# Patient Record
Sex: Male | Born: 1935 | Race: White | Hispanic: No | Marital: Married | Smoking: Former smoker
Health system: Southern US, Community
[De-identification: ages and names within clinical notes are randomized; demographics above are authoritative.]

---

## 2000-01-01 ENCOUNTER — Other Ambulatory Visit: Admission: RE | Admit: 2000-01-01 | Discharge: 2000-01-01 | Payer: Self-pay | Admitting: Urology

## 2006-03-16 ENCOUNTER — Ambulatory Visit (HOSPITAL_COMMUNITY): Admission: RE | Admit: 2006-03-16 | Discharge: 2006-03-16 | Payer: Self-pay | Admitting: *Deleted

## 2011-05-04 DIAGNOSIS — H524 Presbyopia: Secondary | ICD-10-CM | POA: Diagnosis not present

## 2011-05-04 DIAGNOSIS — Z961 Presence of intraocular lens: Secondary | ICD-10-CM | POA: Diagnosis not present

## 2011-05-04 DIAGNOSIS — H35039 Hypertensive retinopathy, unspecified eye: Secondary | ICD-10-CM | POA: Diagnosis not present

## 2011-05-04 DIAGNOSIS — H35379 Puckering of macula, unspecified eye: Secondary | ICD-10-CM | POA: Diagnosis not present

## 2011-05-12 DIAGNOSIS — E789 Disorder of lipoprotein metabolism, unspecified: Secondary | ICD-10-CM | POA: Diagnosis not present

## 2011-05-19 DIAGNOSIS — N400 Enlarged prostate without lower urinary tract symptoms: Secondary | ICD-10-CM | POA: Diagnosis not present

## 2011-05-19 DIAGNOSIS — M79609 Pain in unspecified limb: Secondary | ICD-10-CM | POA: Diagnosis not present

## 2011-05-19 DIAGNOSIS — E789 Disorder of lipoprotein metabolism, unspecified: Secondary | ICD-10-CM | POA: Diagnosis not present

## 2011-06-24 DIAGNOSIS — M25559 Pain in unspecified hip: Secondary | ICD-10-CM | POA: Diagnosis not present

## 2011-06-24 DIAGNOSIS — M76899 Other specified enthesopathies of unspecified lower limb, excluding foot: Secondary | ICD-10-CM | POA: Diagnosis not present

## 2011-06-24 DIAGNOSIS — M6740 Ganglion, unspecified site: Secondary | ICD-10-CM | POA: Diagnosis not present

## 2011-09-11 DIAGNOSIS — E789 Disorder of lipoprotein metabolism, unspecified: Secondary | ICD-10-CM | POA: Diagnosis not present

## 2011-09-25 DIAGNOSIS — M79609 Pain in unspecified limb: Secondary | ICD-10-CM | POA: Diagnosis not present

## 2011-09-25 DIAGNOSIS — E789 Disorder of lipoprotein metabolism, unspecified: Secondary | ICD-10-CM | POA: Diagnosis not present

## 2011-09-25 DIAGNOSIS — N400 Enlarged prostate without lower urinary tract symptoms: Secondary | ICD-10-CM | POA: Diagnosis not present

## 2011-11-09 DIAGNOSIS — H1130 Conjunctival hemorrhage, unspecified eye: Secondary | ICD-10-CM | POA: Diagnosis not present

## 2011-12-22 DIAGNOSIS — Z23 Encounter for immunization: Secondary | ICD-10-CM | POA: Diagnosis not present

## 2012-01-18 DIAGNOSIS — E789 Disorder of lipoprotein metabolism, unspecified: Secondary | ICD-10-CM | POA: Diagnosis not present

## 2012-01-18 DIAGNOSIS — Z125 Encounter for screening for malignant neoplasm of prostate: Secondary | ICD-10-CM | POA: Diagnosis not present

## 2012-01-18 DIAGNOSIS — Z0000 Encounter for general adult medical examination without abnormal findings: Secondary | ICD-10-CM | POA: Diagnosis not present

## 2012-01-18 DIAGNOSIS — Z79899 Other long term (current) drug therapy: Secondary | ICD-10-CM | POA: Diagnosis not present

## 2012-01-25 DIAGNOSIS — E789 Disorder of lipoprotein metabolism, unspecified: Secondary | ICD-10-CM | POA: Diagnosis not present

## 2012-01-25 DIAGNOSIS — G252 Other specified forms of tremor: Secondary | ICD-10-CM | POA: Diagnosis not present

## 2012-01-25 DIAGNOSIS — N400 Enlarged prostate without lower urinary tract symptoms: Secondary | ICD-10-CM | POA: Diagnosis not present

## 2012-02-05 DIAGNOSIS — G250 Essential tremor: Secondary | ICD-10-CM | POA: Diagnosis not present

## 2012-02-05 DIAGNOSIS — G252 Other specified forms of tremor: Secondary | ICD-10-CM | POA: Diagnosis not present

## 2012-05-02 DIAGNOSIS — G250 Essential tremor: Secondary | ICD-10-CM | POA: Diagnosis not present

## 2012-05-02 DIAGNOSIS — G252 Other specified forms of tremor: Secondary | ICD-10-CM | POA: Diagnosis not present

## 2012-05-02 DIAGNOSIS — N400 Enlarged prostate without lower urinary tract symptoms: Secondary | ICD-10-CM | POA: Diagnosis not present

## 2012-05-02 DIAGNOSIS — E789 Disorder of lipoprotein metabolism, unspecified: Secondary | ICD-10-CM | POA: Diagnosis not present

## 2012-05-05 DIAGNOSIS — H35039 Hypertensive retinopathy, unspecified eye: Secondary | ICD-10-CM | POA: Diagnosis not present

## 2012-05-05 DIAGNOSIS — Z961 Presence of intraocular lens: Secondary | ICD-10-CM | POA: Diagnosis not present

## 2012-06-03 DIAGNOSIS — N400 Enlarged prostate without lower urinary tract symptoms: Secondary | ICD-10-CM | POA: Diagnosis not present

## 2012-06-03 DIAGNOSIS — R5381 Other malaise: Secondary | ICD-10-CM | POA: Diagnosis not present

## 2012-06-03 DIAGNOSIS — E789 Disorder of lipoprotein metabolism, unspecified: Secondary | ICD-10-CM | POA: Diagnosis not present

## 2012-06-03 DIAGNOSIS — R5383 Other fatigue: Secondary | ICD-10-CM | POA: Diagnosis not present

## 2012-11-24 DIAGNOSIS — Z23 Encounter for immunization: Secondary | ICD-10-CM | POA: Diagnosis not present

## 2013-01-18 DIAGNOSIS — E789 Disorder of lipoprotein metabolism, unspecified: Secondary | ICD-10-CM | POA: Diagnosis not present

## 2013-01-18 DIAGNOSIS — Z79899 Other long term (current) drug therapy: Secondary | ICD-10-CM | POA: Diagnosis not present

## 2013-01-18 DIAGNOSIS — Z125 Encounter for screening for malignant neoplasm of prostate: Secondary | ICD-10-CM | POA: Diagnosis not present

## 2013-01-25 DIAGNOSIS — I10 Essential (primary) hypertension: Secondary | ICD-10-CM | POA: Diagnosis not present

## 2013-01-25 DIAGNOSIS — N400 Enlarged prostate without lower urinary tract symptoms: Secondary | ICD-10-CM | POA: Diagnosis not present

## 2013-01-25 DIAGNOSIS — E789 Disorder of lipoprotein metabolism, unspecified: Secondary | ICD-10-CM | POA: Diagnosis not present

## 2013-01-25 DIAGNOSIS — Z23 Encounter for immunization: Secondary | ICD-10-CM | POA: Diagnosis not present

## 2013-02-01 DIAGNOSIS — H6120 Impacted cerumen, unspecified ear: Secondary | ICD-10-CM | POA: Diagnosis not present

## 2013-02-03 DIAGNOSIS — N401 Enlarged prostate with lower urinary tract symptoms: Secondary | ICD-10-CM | POA: Diagnosis not present

## 2013-02-03 DIAGNOSIS — N139 Obstructive and reflux uropathy, unspecified: Secondary | ICD-10-CM | POA: Diagnosis not present

## 2013-07-26 DIAGNOSIS — I10 Essential (primary) hypertension: Secondary | ICD-10-CM | POA: Diagnosis not present

## 2013-07-26 DIAGNOSIS — N400 Enlarged prostate without lower urinary tract symptoms: Secondary | ICD-10-CM | POA: Diagnosis not present

## 2013-07-26 DIAGNOSIS — E789 Disorder of lipoprotein metabolism, unspecified: Secondary | ICD-10-CM | POA: Diagnosis not present

## 2013-08-02 DIAGNOSIS — E789 Disorder of lipoprotein metabolism, unspecified: Secondary | ICD-10-CM | POA: Diagnosis not present

## 2013-08-23 DIAGNOSIS — H04129 Dry eye syndrome of unspecified lacrimal gland: Secondary | ICD-10-CM | POA: Diagnosis not present

## 2013-08-23 DIAGNOSIS — H524 Presbyopia: Secondary | ICD-10-CM | POA: Diagnosis not present

## 2013-08-23 DIAGNOSIS — H26499 Other secondary cataract, unspecified eye: Secondary | ICD-10-CM | POA: Diagnosis not present

## 2013-08-23 DIAGNOSIS — H35379 Puckering of macula, unspecified eye: Secondary | ICD-10-CM | POA: Diagnosis not present

## 2013-11-09 DIAGNOSIS — H5789 Other specified disorders of eye and adnexa: Secondary | ICD-10-CM | POA: Diagnosis not present

## 2013-11-27 DIAGNOSIS — Z23 Encounter for immunization: Secondary | ICD-10-CM | POA: Diagnosis not present

## 2014-01-22 DIAGNOSIS — Z125 Encounter for screening for malignant neoplasm of prostate: Secondary | ICD-10-CM | POA: Diagnosis not present

## 2014-01-22 DIAGNOSIS — Z79899 Other long term (current) drug therapy: Secondary | ICD-10-CM | POA: Diagnosis not present

## 2014-01-22 DIAGNOSIS — I10 Essential (primary) hypertension: Secondary | ICD-10-CM | POA: Diagnosis not present

## 2014-01-22 DIAGNOSIS — E789 Disorder of lipoprotein metabolism, unspecified: Secondary | ICD-10-CM | POA: Diagnosis not present

## 2014-01-29 DIAGNOSIS — N400 Enlarged prostate without lower urinary tract symptoms: Secondary | ICD-10-CM | POA: Diagnosis not present

## 2014-01-29 DIAGNOSIS — E032 Hypothyroidism due to medicaments and other exogenous substances: Secondary | ICD-10-CM | POA: Diagnosis not present

## 2014-01-29 DIAGNOSIS — I10 Essential (primary) hypertension: Secondary | ICD-10-CM | POA: Diagnosis not present

## 2014-01-29 DIAGNOSIS — G250 Essential tremor: Secondary | ICD-10-CM | POA: Diagnosis not present

## 2014-07-24 DIAGNOSIS — E789 Disorder of lipoprotein metabolism, unspecified: Secondary | ICD-10-CM | POA: Diagnosis not present

## 2014-07-31 DIAGNOSIS — Z23 Encounter for immunization: Secondary | ICD-10-CM | POA: Diagnosis not present

## 2014-07-31 DIAGNOSIS — N400 Enlarged prostate without lower urinary tract symptoms: Secondary | ICD-10-CM | POA: Diagnosis not present

## 2014-07-31 DIAGNOSIS — I10 Essential (primary) hypertension: Secondary | ICD-10-CM | POA: Diagnosis not present

## 2014-07-31 DIAGNOSIS — E789 Disorder of lipoprotein metabolism, unspecified: Secondary | ICD-10-CM | POA: Diagnosis not present

## 2014-11-29 DIAGNOSIS — Z23 Encounter for immunization: Secondary | ICD-10-CM | POA: Diagnosis not present

## 2015-01-24 DIAGNOSIS — E789 Disorder of lipoprotein metabolism, unspecified: Secondary | ICD-10-CM | POA: Diagnosis not present

## 2015-01-24 DIAGNOSIS — Z0000 Encounter for general adult medical examination without abnormal findings: Secondary | ICD-10-CM | POA: Diagnosis not present

## 2015-01-24 DIAGNOSIS — I10 Essential (primary) hypertension: Secondary | ICD-10-CM | POA: Diagnosis not present

## 2015-01-24 DIAGNOSIS — Z125 Encounter for screening for malignant neoplasm of prostate: Secondary | ICD-10-CM | POA: Diagnosis not present

## 2015-01-31 DIAGNOSIS — E789 Disorder of lipoprotein metabolism, unspecified: Secondary | ICD-10-CM | POA: Diagnosis not present

## 2015-01-31 DIAGNOSIS — N390 Urinary tract infection, site not specified: Secondary | ICD-10-CM | POA: Diagnosis not present

## 2015-01-31 DIAGNOSIS — N400 Enlarged prostate without lower urinary tract symptoms: Secondary | ICD-10-CM | POA: Diagnosis not present

## 2015-01-31 DIAGNOSIS — F439 Reaction to severe stress, unspecified: Secondary | ICD-10-CM | POA: Diagnosis not present

## 2015-01-31 DIAGNOSIS — R011 Cardiac murmur, unspecified: Secondary | ICD-10-CM | POA: Diagnosis not present

## 2015-02-21 DIAGNOSIS — R351 Nocturia: Secondary | ICD-10-CM | POA: Diagnosis not present

## 2015-02-21 DIAGNOSIS — R972 Elevated prostate specific antigen [PSA]: Secondary | ICD-10-CM | POA: Diagnosis not present

## 2015-02-21 DIAGNOSIS — N401 Enlarged prostate with lower urinary tract symptoms: Secondary | ICD-10-CM | POA: Diagnosis not present

## 2015-07-30 DIAGNOSIS — E789 Disorder of lipoprotein metabolism, unspecified: Secondary | ICD-10-CM | POA: Diagnosis not present

## 2015-07-30 DIAGNOSIS — N400 Enlarged prostate without lower urinary tract symptoms: Secondary | ICD-10-CM | POA: Diagnosis not present

## 2015-08-01 DIAGNOSIS — E789 Disorder of lipoprotein metabolism, unspecified: Secondary | ICD-10-CM | POA: Diagnosis not present

## 2015-08-01 DIAGNOSIS — I10 Essential (primary) hypertension: Secondary | ICD-10-CM | POA: Diagnosis not present

## 2015-11-26 DIAGNOSIS — Z23 Encounter for immunization: Secondary | ICD-10-CM | POA: Diagnosis not present

## 2016-01-27 DIAGNOSIS — E789 Disorder of lipoprotein metabolism, unspecified: Secondary | ICD-10-CM | POA: Diagnosis not present

## 2016-01-27 DIAGNOSIS — Z125 Encounter for screening for malignant neoplasm of prostate: Secondary | ICD-10-CM | POA: Diagnosis not present

## 2016-01-27 DIAGNOSIS — I10 Essential (primary) hypertension: Secondary | ICD-10-CM | POA: Diagnosis not present

## 2016-01-27 DIAGNOSIS — R011 Cardiac murmur, unspecified: Secondary | ICD-10-CM | POA: Diagnosis not present

## 2016-02-04 DIAGNOSIS — E789 Disorder of lipoprotein metabolism, unspecified: Secondary | ICD-10-CM | POA: Diagnosis not present

## 2016-02-04 DIAGNOSIS — T50905A Adverse effect of unspecified drugs, medicaments and biological substances, initial encounter: Secondary | ICD-10-CM | POA: Diagnosis not present

## 2016-02-04 DIAGNOSIS — N400 Enlarged prostate without lower urinary tract symptoms: Secondary | ICD-10-CM | POA: Diagnosis not present

## 2016-04-28 DIAGNOSIS — E039 Hypothyroidism, unspecified: Secondary | ICD-10-CM | POA: Diagnosis not present

## 2016-04-28 DIAGNOSIS — E789 Disorder of lipoprotein metabolism, unspecified: Secondary | ICD-10-CM | POA: Diagnosis not present

## 2016-05-05 DIAGNOSIS — R899 Unspecified abnormal finding in specimens from other organs, systems and tissues: Secondary | ICD-10-CM | POA: Diagnosis not present

## 2016-05-05 DIAGNOSIS — E789 Disorder of lipoprotein metabolism, unspecified: Secondary | ICD-10-CM | POA: Diagnosis not present

## 2016-05-05 DIAGNOSIS — N400 Enlarged prostate without lower urinary tract symptoms: Secondary | ICD-10-CM | POA: Diagnosis not present

## 2016-05-26 DIAGNOSIS — I10 Essential (primary) hypertension: Secondary | ICD-10-CM | POA: Diagnosis not present

## 2016-05-26 DIAGNOSIS — E789 Disorder of lipoprotein metabolism, unspecified: Secondary | ICD-10-CM | POA: Diagnosis not present

## 2016-10-06 DIAGNOSIS — E789 Disorder of lipoprotein metabolism, unspecified: Secondary | ICD-10-CM | POA: Diagnosis not present

## 2016-10-06 DIAGNOSIS — N400 Enlarged prostate without lower urinary tract symptoms: Secondary | ICD-10-CM | POA: Diagnosis not present

## 2016-10-19 DIAGNOSIS — L570 Actinic keratosis: Secondary | ICD-10-CM | POA: Diagnosis not present

## 2016-10-19 DIAGNOSIS — D0462 Carcinoma in situ of skin of left upper limb, including shoulder: Secondary | ICD-10-CM | POA: Diagnosis not present

## 2016-10-19 DIAGNOSIS — L821 Other seborrheic keratosis: Secondary | ICD-10-CM | POA: Diagnosis not present

## 2016-10-23 DIAGNOSIS — Z961 Presence of intraocular lens: Secondary | ICD-10-CM | POA: Diagnosis not present

## 2016-10-23 DIAGNOSIS — H40053 Ocular hypertension, bilateral: Secondary | ICD-10-CM | POA: Diagnosis not present

## 2016-10-23 DIAGNOSIS — H35371 Puckering of macula, right eye: Secondary | ICD-10-CM | POA: Diagnosis not present

## 2016-10-23 DIAGNOSIS — H26493 Other secondary cataract, bilateral: Secondary | ICD-10-CM | POA: Diagnosis not present

## 2016-12-17 DIAGNOSIS — Z23 Encounter for immunization: Secondary | ICD-10-CM | POA: Diagnosis not present

## 2017-01-22 DIAGNOSIS — Z85828 Personal history of other malignant neoplasm of skin: Secondary | ICD-10-CM | POA: Diagnosis not present

## 2017-01-22 DIAGNOSIS — L570 Actinic keratosis: Secondary | ICD-10-CM | POA: Diagnosis not present

## 2017-01-22 DIAGNOSIS — L821 Other seborrheic keratosis: Secondary | ICD-10-CM | POA: Diagnosis not present

## 2017-02-11 DIAGNOSIS — I10 Essential (primary) hypertension: Secondary | ICD-10-CM | POA: Diagnosis not present

## 2017-02-11 DIAGNOSIS — Z0000 Encounter for general adult medical examination without abnormal findings: Secondary | ICD-10-CM | POA: Diagnosis not present

## 2017-02-11 DIAGNOSIS — Z125 Encounter for screening for malignant neoplasm of prostate: Secondary | ICD-10-CM | POA: Diagnosis not present

## 2017-02-26 DIAGNOSIS — Z0000 Encounter for general adult medical examination without abnormal findings: Secondary | ICD-10-CM | POA: Diagnosis not present

## 2017-02-26 DIAGNOSIS — R011 Cardiac murmur, unspecified: Secondary | ICD-10-CM | POA: Diagnosis not present

## 2017-02-26 DIAGNOSIS — R972 Elevated prostate specific antigen [PSA]: Secondary | ICD-10-CM | POA: Diagnosis not present

## 2017-02-26 DIAGNOSIS — R2681 Unsteadiness on feet: Secondary | ICD-10-CM | POA: Diagnosis not present

## 2017-08-30 DIAGNOSIS — Z0000 Encounter for general adult medical examination without abnormal findings: Secondary | ICD-10-CM | POA: Diagnosis not present

## 2017-08-30 DIAGNOSIS — R972 Elevated prostate specific antigen [PSA]: Secondary | ICD-10-CM | POA: Diagnosis not present

## 2017-09-06 DIAGNOSIS — N400 Enlarged prostate without lower urinary tract symptoms: Secondary | ICD-10-CM | POA: Diagnosis not present

## 2017-09-06 DIAGNOSIS — E039 Hypothyroidism, unspecified: Secondary | ICD-10-CM | POA: Diagnosis not present

## 2017-09-06 DIAGNOSIS — Z0000 Encounter for general adult medical examination without abnormal findings: Secondary | ICD-10-CM | POA: Diagnosis not present

## 2017-09-06 DIAGNOSIS — E789 Disorder of lipoprotein metabolism, unspecified: Secondary | ICD-10-CM | POA: Diagnosis not present

## 2017-09-06 DIAGNOSIS — R011 Cardiac murmur, unspecified: Secondary | ICD-10-CM | POA: Diagnosis not present

## 2017-09-09 DIAGNOSIS — R1013 Epigastric pain: Secondary | ICD-10-CM | POA: Diagnosis not present

## 2017-09-09 DIAGNOSIS — R011 Cardiac murmur, unspecified: Secondary | ICD-10-CM | POA: Diagnosis not present

## 2017-09-22 DIAGNOSIS — N400 Enlarged prostate without lower urinary tract symptoms: Secondary | ICD-10-CM | POA: Diagnosis not present

## 2017-09-22 DIAGNOSIS — E782 Mixed hyperlipidemia: Secondary | ICD-10-CM | POA: Diagnosis not present

## 2017-09-22 DIAGNOSIS — Z1211 Encounter for screening for malignant neoplasm of colon: Secondary | ICD-10-CM | POA: Diagnosis not present

## 2017-10-28 DIAGNOSIS — H40053 Ocular hypertension, bilateral: Secondary | ICD-10-CM | POA: Diagnosis not present

## 2017-10-28 DIAGNOSIS — H40013 Open angle with borderline findings, low risk, bilateral: Secondary | ICD-10-CM | POA: Diagnosis not present

## 2017-10-28 DIAGNOSIS — H35371 Puckering of macula, right eye: Secondary | ICD-10-CM | POA: Diagnosis not present

## 2017-10-28 DIAGNOSIS — Z961 Presence of intraocular lens: Secondary | ICD-10-CM | POA: Diagnosis not present

## 2017-12-03 DIAGNOSIS — Z23 Encounter for immunization: Secondary | ICD-10-CM | POA: Diagnosis not present

## 2018-02-07 DIAGNOSIS — Z85828 Personal history of other malignant neoplasm of skin: Secondary | ICD-10-CM | POA: Diagnosis not present

## 2018-02-07 DIAGNOSIS — L570 Actinic keratosis: Secondary | ICD-10-CM | POA: Diagnosis not present

## 2018-02-07 DIAGNOSIS — L821 Other seborrheic keratosis: Secondary | ICD-10-CM | POA: Diagnosis not present

## 2018-02-07 DIAGNOSIS — D225 Melanocytic nevi of trunk: Secondary | ICD-10-CM | POA: Diagnosis not present

## 2018-03-09 DIAGNOSIS — E789 Disorder of lipoprotein metabolism, unspecified: Secondary | ICD-10-CM | POA: Diagnosis not present

## 2018-03-09 DIAGNOSIS — Z0000 Encounter for general adult medical examination without abnormal findings: Secondary | ICD-10-CM | POA: Diagnosis not present

## 2018-03-09 DIAGNOSIS — E039 Hypothyroidism, unspecified: Secondary | ICD-10-CM | POA: Diagnosis not present

## 2018-03-16 DIAGNOSIS — R011 Cardiac murmur, unspecified: Secondary | ICD-10-CM | POA: Diagnosis not present

## 2018-03-16 DIAGNOSIS — Z0000 Encounter for general adult medical examination without abnormal findings: Secondary | ICD-10-CM | POA: Diagnosis not present

## 2018-03-16 DIAGNOSIS — Z23 Encounter for immunization: Secondary | ICD-10-CM | POA: Diagnosis not present

## 2018-05-02 DIAGNOSIS — H40023 Open angle with borderline findings, high risk, bilateral: Secondary | ICD-10-CM | POA: Diagnosis not present

## 2018-05-02 DIAGNOSIS — H40053 Ocular hypertension, bilateral: Secondary | ICD-10-CM | POA: Diagnosis not present

## 2018-05-25 DIAGNOSIS — H40021 Open angle with borderline findings, high risk, right eye: Secondary | ICD-10-CM | POA: Diagnosis not present

## 2018-05-25 DIAGNOSIS — H40051 Ocular hypertension, right eye: Secondary | ICD-10-CM | POA: Diagnosis not present

## 2018-06-07 DIAGNOSIS — H40052 Ocular hypertension, left eye: Secondary | ICD-10-CM | POA: Diagnosis not present

## 2018-07-19 DIAGNOSIS — H40023 Open angle with borderline findings, high risk, bilateral: Secondary | ICD-10-CM | POA: Diagnosis not present

## 2018-07-19 DIAGNOSIS — H40053 Ocular hypertension, bilateral: Secondary | ICD-10-CM | POA: Diagnosis not present

## 2018-09-07 DIAGNOSIS — I10 Essential (primary) hypertension: Secondary | ICD-10-CM | POA: Diagnosis not present

## 2018-09-07 DIAGNOSIS — E039 Hypothyroidism, unspecified: Secondary | ICD-10-CM | POA: Diagnosis not present

## 2018-09-07 DIAGNOSIS — E7800 Pure hypercholesterolemia, unspecified: Secondary | ICD-10-CM | POA: Diagnosis not present

## 2018-09-14 DIAGNOSIS — E7800 Pure hypercholesterolemia, unspecified: Secondary | ICD-10-CM | POA: Diagnosis not present

## 2018-09-14 DIAGNOSIS — I10 Essential (primary) hypertension: Secondary | ICD-10-CM | POA: Diagnosis not present

## 2018-09-14 DIAGNOSIS — E039 Hypothyroidism, unspecified: Secondary | ICD-10-CM | POA: Diagnosis not present

## 2018-09-14 DIAGNOSIS — Q253 Supravalvular aortic stenosis: Secondary | ICD-10-CM | POA: Diagnosis not present

## 2018-09-22 DIAGNOSIS — Q253 Supravalvular aortic stenosis: Secondary | ICD-10-CM | POA: Diagnosis not present

## 2019-01-10 DIAGNOSIS — I10 Essential (primary) hypertension: Secondary | ICD-10-CM | POA: Diagnosis not present

## 2019-01-10 DIAGNOSIS — E039 Hypothyroidism, unspecified: Secondary | ICD-10-CM | POA: Diagnosis not present

## 2019-01-16 ENCOUNTER — Other Ambulatory Visit: Payer: Self-pay

## 2019-01-16 NOTE — Patient Outreach (Signed)
[facility]) Care Management  01/16/2019  Colin Robbins 09/27/1931 [id number]   Medication Adherence call to Mr. Colin Robbins HIPPA Compliant Voice message left with a call back number.[name] is showing past due on Rosuvastatin 20 mg under [place].   [place] Management Direct Dial [phone]  Fax [phone] [email or web address]

## 2019-01-17 DIAGNOSIS — Z961 Presence of intraocular lens: Secondary | ICD-10-CM | POA: Diagnosis not present

## 2019-01-17 DIAGNOSIS — H40023 Open angle with borderline findings, high risk, bilateral: Secondary | ICD-10-CM | POA: Diagnosis not present

## 2019-01-17 DIAGNOSIS — H43393 Other vitreous opacities, bilateral: Secondary | ICD-10-CM | POA: Diagnosis not present

## 2019-01-17 DIAGNOSIS — H40053 Ocular hypertension, bilateral: Secondary | ICD-10-CM | POA: Diagnosis not present

## 2019-01-17 DIAGNOSIS — E7800 Pure hypercholesterolemia, unspecified: Secondary | ICD-10-CM | POA: Diagnosis not present

## 2019-01-17 DIAGNOSIS — Z23 Encounter for immunization: Secondary | ICD-10-CM | POA: Diagnosis not present

## 2019-01-17 DIAGNOSIS — E039 Hypothyroidism, unspecified: Secondary | ICD-10-CM | POA: Diagnosis not present

## 2019-01-17 DIAGNOSIS — H35371 Puckering of macula, right eye: Secondary | ICD-10-CM | POA: Diagnosis not present

## 2019-01-25 ENCOUNTER — Other Ambulatory Visit: Payer: Self-pay

## 2019-01-25 NOTE — Patient Outreach (Signed)
[facility]) Care Management  01/25/2019  Colin Robbins October 08, 1931 [id number]   Medication Adherence call to Mr. Colin Robbins HIPPA Compliant Voice message left with a call back number. Colin Robbins is showing past due on Rosuvastatin 20 mg under [place].   [place] Management Direct Dial [phone]  Fax [phone] [email or web address]

## 2019-02-08 DIAGNOSIS — D485 Neoplasm of uncertain behavior of skin: Secondary | ICD-10-CM | POA: Diagnosis not present

## 2019-02-08 DIAGNOSIS — D225 Melanocytic nevi of trunk: Secondary | ICD-10-CM | POA: Diagnosis not present

## 2019-02-08 DIAGNOSIS — D0439 Carcinoma in situ of skin of other parts of face: Secondary | ICD-10-CM | POA: Diagnosis not present

## 2019-02-08 DIAGNOSIS — Z85828 Personal history of other malignant neoplasm of skin: Secondary | ICD-10-CM | POA: Diagnosis not present

## 2019-02-08 DIAGNOSIS — L570 Actinic keratosis: Secondary | ICD-10-CM | POA: Diagnosis not present

## 2019-02-08 DIAGNOSIS — L821 Other seborrheic keratosis: Secondary | ICD-10-CM | POA: Diagnosis not present

## 2019-07-10 DIAGNOSIS — E7800 Pure hypercholesterolemia, unspecified: Secondary | ICD-10-CM | POA: Diagnosis not present

## 2019-07-10 DIAGNOSIS — E039 Hypothyroidism, unspecified: Secondary | ICD-10-CM | POA: Diagnosis not present

## 2019-07-18 DIAGNOSIS — H40023 Open angle with borderline findings, high risk, bilateral: Secondary | ICD-10-CM | POA: Diagnosis not present

## 2019-07-18 DIAGNOSIS — H43393 Other vitreous opacities, bilateral: Secondary | ICD-10-CM | POA: Diagnosis not present

## 2019-07-18 DIAGNOSIS — Z961 Presence of intraocular lens: Secondary | ICD-10-CM | POA: Diagnosis not present

## 2019-07-18 DIAGNOSIS — H35371 Puckering of macula, right eye: Secondary | ICD-10-CM | POA: Diagnosis not present

## 2019-08-01 DIAGNOSIS — Z0000 Encounter for general adult medical examination without abnormal findings: Secondary | ICD-10-CM | POA: Diagnosis not present

## 2019-08-01 DIAGNOSIS — I10 Essential (primary) hypertension: Secondary | ICD-10-CM | POA: Diagnosis not present

## 2019-10-11 DIAGNOSIS — M791 Myalgia, unspecified site: Secondary | ICD-10-CM | POA: Diagnosis not present

## 2019-10-11 DIAGNOSIS — M79602 Pain in left arm: Secondary | ICD-10-CM | POA: Diagnosis not present

## 2019-10-16 ENCOUNTER — Other Ambulatory Visit: Payer: Self-pay

## 2019-10-16 ENCOUNTER — Ambulatory Visit (INDEPENDENT_AMBULATORY_CARE_PROVIDER_SITE_OTHER): Payer: Medicare Other | Admitting: Family Medicine

## 2019-10-16 ENCOUNTER — Ambulatory Visit: Payer: Self-pay

## 2019-10-16 ENCOUNTER — Ambulatory Visit (INDEPENDENT_AMBULATORY_CARE_PROVIDER_SITE_OTHER): Payer: Medicare Other

## 2019-10-16 VITALS — BP 130/78 | HR 78 | Ht 72.0 in | Wt 168.4 lb

## 2019-10-16 DIAGNOSIS — M79622 Pain in left upper arm: Secondary | ICD-10-CM

## 2019-10-16 DIAGNOSIS — M542 Cervicalgia: Secondary | ICD-10-CM | POA: Diagnosis not present

## 2019-10-16 MED ORDER — GABAPENTIN 100 MG PO CAPS: 100.0000 mg | ORAL_CAPSULE | Freq: Every evening | ORAL | 3 refills | Status: DC | PRN

## 2019-10-16 NOTE — Patient Instructions (Addendum)
.  Thank you for coming in today. Plan for xray today.  Start gabapentin at bedtime as needed for nerve pain.  Let me know if you want to do physical therapy  Next step after physically therapy is MRI or injection or both

## 2019-10-16 NOTE — Progress Notes (Signed)
Subjective:    CC: L shoulder and arm pain  I, Molly Weber, LAT, ATC, am serving as scribe for Dr. Lynne Leader.  HPI: Pt is an 86 y/o RHD male presenting w/ c/o L shoulder and arm pain x 3 months.  He states that he began having pain about 3 weeks after his 2nd Covid vaccine in Feb.  He locates his pain to his L upper arm from shoulder to elbow.  He did have some pain extending to his forearm but not to his hand originally.  This is since resolved.  Pain is not worse at all with shoulder motion.  Pain is typically worse in the evening when he tried to sleep.  Radiating pain: yes from L shoulder to elbow/forearm L shoulder mechanical symptoms: No Neck pain: nothing more than usual Aggravating factors: inactivity and L sidelying Treatments tried: Tramadol; Tylenol; Advil; topical pain relieving cream; heat; ice  Pertinent review of Systems: No fevers or chills  Relevant historical information: Hyperlipidemia   Objective:    Vitals:   10/16/19 1420  BP: 130/78  Pulse: 78  SpO2: 97%   General: Well Developed, well nourished, and in no acute distress.   MSK: C-spine normal-appearing nontender decreased cervical motion. Upper semistrength reflexes and sensation are equal and normal throughout. Left shoulder normal-appearing nontender normal motion normal strength negative impingement testing.  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  No acute fractures. Await formal radiology review   Impression and Recommendations:    Assessment and Plan: 86 y.o. male with left arm pain.  Doubtful for shoulder etiology given his excellent range of motion and strength and negative impingement testing.  Most likely diagnosis is C5 or T1 radiculopathy.  Additionally could be radial nerve irritation or injury from at the COVID-19 vaccine back in February.  This is less  likely as his pain did not start for 3 weeks after the vaccine.  Discussed options.  Trial of low-dose gabapentin.  Consider physical therapy and further work-up and treatment if needed.  Patient will let me know.Marland Kitchen  PDMP not reviewed this encounter. Orders Placed This Encounter  Procedures  . [place] LIMITED JOINT SPACE STRUCTURES UP LEFT(NO LINKED CHARGES)    Order Specific Question:   Reason for Exam (SYMPTOM  OR DIAGNOSIS REQUIRED)    Answer:   L upper arm pain    Order Specific Question:   Preferred imaging location?    Answer:   [place]  . DG Cervical Spine 2 or 3 views    Standing Status:   Future    Number of Occurrences:   1    Standing Expiration Date:   10/15/2020    Order Specific Question:   Reason for Exam (SYMPTOM  OR DIAGNOSIS REQUIRED)    Answer:   eval possible cervical rad left C5 or T1    Order Specific Question:   Preferred imaging location?    Answer:   [facility]    Order Specific Question:   Radiology Contrast Protocol - do NOT remove file path    Answer:   \\[email or web address].pdf   Meds ordered this encounter  Medications  . gabapentin (NEURONTIN) 100 MG capsule    Sig: Take 1-3 capsules (100-300 mg total) by mouth at bedtime as needed (nerve pain).    Dispense:  90 capsule    Refill:  3    Discussed  warning signs or symptoms. Please see discharge instructions. Patient expresses understanding.   The above documentation has been reviewed and is accurate and complete Lynne Leader, M.D.

## 2019-10-17 NOTE — Progress Notes (Signed)
X-ray cervical spine shows some arthritis and narrowing at C5-6 and C6-7.  This could potentially cause a bulging disc pressing on a nerve down your arm.  If not better MRI will show this in more detail.

## 2019-12-01 ENCOUNTER — Encounter: Payer: Self-pay | Admitting: Family Medicine

## 2019-12-01 ENCOUNTER — Ambulatory Visit: Payer: Medicare Other | Admitting: Family Medicine

## 2019-12-01 ENCOUNTER — Other Ambulatory Visit: Payer: Self-pay

## 2019-12-01 ENCOUNTER — Ambulatory Visit: Payer: Self-pay

## 2019-12-01 ENCOUNTER — Ambulatory Visit (INDEPENDENT_AMBULATORY_CARE_PROVIDER_SITE_OTHER): Payer: Medicare Other

## 2019-12-01 VITALS — BP 180/110 | HR 65 | Ht 72.0 in | Wt 170.8 lb

## 2019-12-01 DIAGNOSIS — M7532 Calcific tendinitis of left shoulder: Secondary | ICD-10-CM

## 2019-12-01 DIAGNOSIS — M79622 Pain in left upper arm: Secondary | ICD-10-CM | POA: Diagnosis not present

## 2019-12-01 DIAGNOSIS — G5632 Lesion of radial nerve, left upper limb: Secondary | ICD-10-CM

## 2019-12-01 DIAGNOSIS — M7582 Other shoulder lesions, left shoulder: Secondary | ICD-10-CM | POA: Diagnosis not present

## 2019-12-01 DIAGNOSIS — M19012 Primary osteoarthritis, left shoulder: Secondary | ICD-10-CM | POA: Diagnosis not present

## 2019-12-01 NOTE — Patient Instructions (Signed)
Thank you for coming in today.  Please get an Xray today before you leave  I've referred you to Physical Therapy.  Let us know if you don't hear from them in one week.  Call or go to the ER if you develop a large red swollen joint with extreme pain or oozing puss.   Let me know if not better with the shot and with PT.

## 2019-12-01 NOTE — Progress Notes (Signed)
I, Wendy Poet, LAT, ATC, am serving as scribe for Dr. Lynne Leader.  Colin Robbins is a 86 y.o. male who presents to [place] at [facility] today for f/u of L upper arm pain that began approximately 3 weeks after getting his 2nd Covid vaccine.  He was last seen by Dr. Georgina Snell on 10/16/19 and was prescribed Gabapentin.  Since his last visit, pt reports that his pain remains about the same and comes and goes.  He states that he con't to get some paresthesias in his L arm as well.  Diagnostic testing: C-spine XR- 10/16/19   Pertinent review of systems: No fevers or chills  Relevant historical information: Hyperlipidemia   Exam:  BP (!) 180/110 (BP Location: Right Arm, Patient Position: Sitting, Cuff Size: Normal)   Pulse 65   Ht 6' (1.829 m)   Wt 170 lb 12.8 oz (77.5 kg)   SpO2 97%   BMI 23.16 kg/m  General: Well Developed, well nourished, and in no acute distress.   MSK: Left arm and shoulder normal-appearing Normal shoulder motion. Strength intact abduction external/internal rotation. Negative Hawkins and Neer's test. Negative empty can test. Negative Yergason's and speeds test. Nontender all that shoulder.  Tender palpation at lateral upper arm just distal to the end of deltoid. Distally sensation is intact as is strength.  Pulses cap refill are intact distally. Slight swelling left hand compared to right.    Lab and Radiology Results  X-ray images left shoulder obtained today personally and independently interpreted. Moderate glenohumeral DJD and AC DJD.  No acute fractures.  Normal appearance of the humerus at area of tenderness visualized on x-ray.  Calcific rounded structure seen at distal subscapularis tendon insertion on axial view consistent with chronic calcific tendinopathy. Await formal radiology review  DG Cervical Spine 2 or 3 views  Result Date: 10/16/2019 CLINICAL DATA:  Left-sided radiculopathy with neck pain EXAM: CERVICAL SPINE - 3  VIEW COMPARISON:  None. FINDINGS: Seven cervical segments are well visualized. Vertebral body height is well maintained. Disc space narrowing at C5-6 and C6-7 is noted. No soft tissue abnormality is noted. Chronic calcifications are noted on the left. IMPRESSION: No acute abnormality noted. Electronically Signed   By: Inez Catalina M.D.   On: 10/16/2019 23:30   I, Lynne Leader, personally (independently) visualized and performed the interpretation of the images attached in this note.  Diagnostic Limited MSK Ultrasound of: Left shoulder and upper arm Biceps tendon normal.  Intact in bicipital groove. Subscapularis tendon is intact but hypoechoic changes present near distal tendon insertion indicating chronic calcific tendinopathy. Supraspinatus tendon is intact Infraspinatus tendon is intact. AC joint narrowed degenerative. Area of pain at lateral upper arm visualized.  Radial nerve visualized in this area tender and painful with ultrasound probe pressure.  Radial nerve is normal appearing Impression: Pain at radial nerve lateral upper arm just distal to insertion of deltoid. Subscapularis chronic calcific tendinopathy  Procedure: Real-time Ultrasound Guided Injection of radial nerve left lateral upper arm Device: Philips Affiniti 50G Images permanently stored and available for review in PACS Verbal informed consent obtained.  Discussed risks and benefits of procedure. Warned about infection bleeding damage to structures skin hypopigmentation and fat atrophy among others. Patient expresses understanding and agreement Time-out conducted.   Noted no overlying erythema, induration, or other signs of local infection.   Skin prepped in a sterile fashion.   Local anesthesia: Topical Ethyl chloride.   With sterile technique and under real time ultrasound  guidance:  40 mg of Kenalog and 1 mL of lidocaine injected into soft tissue structures surrounding radial nerve. Fluid seen entering the soft tissue.    Completed without difficulty   Pain immediately resolved suggesting accurate placement of the medication.   Advised to call if fevers/chills, erythema, induration, drainage, or persistent bleeding.   Images permanently stored and available for review in the ultrasound unit.  Impression: Technically successful ultrasound guided injection.    Assessment and Plan: 86 y.o. male with left arm pain.  Differential diagnosis is a bit unclear at this point.  Pain could be multifactorial.   Differential includes: Radial nerve irritation secondary to vaccine administration 6 months ago. Referred pain from rotator cuff tendinoplasty Cervical radiculopathy.  Plan for injection as above and continue gabapentin.  Refer to physical therapy and check back if not improving.   PDMP not reviewed this encounter. Orders Placed This Encounter  Procedures  . [place] LIMITED JOINT SPACE STRUCTURES UP LEFT(NO LINKED CHARGES)    Order Specific Question:   Reason for Exam (SYMPTOM  OR DIAGNOSIS REQUIRED)    Answer:   L shoulder pain    Order Specific Question:   Preferred imaging location?    Answer:   [facility]  . DG Shoulder Left    Standing Status:   Future    Number of Occurrences:   1    Standing Expiration Date:   11/30/2020    Order Specific Question:   Reason for Exam (SYMPTOM  OR DIAGNOSIS REQUIRED)    Answer:   eval shoulder pain    Order Specific Question:   Preferred imaging location?    Answer:   [facility]  . Ambulatory referral to Physical Therapy    Referral Priority:   Routine    Referral Type:   Physical Medicine    Referral Reason:   Specialty Services Required    Requested Specialty:   Physical Therapy   No orders of the defined types were placed in this encounter.    Discussed warning signs or symptoms. Please see discharge instructions. Patient expresses understanding.   The above documentation has been reviewed and is accurate and complete  Lynne Leader, M.D.

## 2019-12-04 NOTE — Progress Notes (Signed)
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

## 2019-12-15 DIAGNOSIS — Z23 Encounter for immunization: Secondary | ICD-10-CM | POA: Diagnosis not present

## 2019-12-26 ENCOUNTER — Ambulatory Visit: Payer: Medicare Other | Attending: Family Medicine

## 2019-12-26 ENCOUNTER — Other Ambulatory Visit: Payer: Self-pay

## 2019-12-26 DIAGNOSIS — M79602 Pain in left arm: Secondary | ICD-10-CM | POA: Diagnosis not present

## 2019-12-26 NOTE — Therapy (Addendum)
[place], [place], [address] Phone: [phone]   Fax:  [phone]  Physical Therapy Evaluation Only  Patient Details  Name: Colin Robbins MRN: [id number] Date of Birth: [birth year] Referring Provider (PT): Gregor Hams, MD   Encounter Date: 12/26/2019   PT End of Session - 12/26/19 1332    Visit Number 1    Number of Visits 1    Authorization Type [employer] MEDICARE    PT Start Time 8676    PT Stop Time 1250    PT Time Calculation (min) 35 min           History reviewed. No pertinent past medical history.  History reviewed. No pertinent surgical history.  There were no vitals filed for this visit.    Subjective Assessment - 12/26/19 1316    Subjective Pt reports in March he developed L lateral arm pain which radiated to his wrist after his 2nd covid shot 3 weeks earlier. Since an appt. c Dr. Georgina Snell on 12/01/19 and receiving a a steroid/novacaine shot in his L arm, his pain is 95% better with only 1-2/10 pain of the lateral arm (none down his L forearm), occuring only a couple of times a week. He states he is very pleased how his pain has improved and he completes work around his home and yard, i.e. shoveling, hammering and cutting the grass without difficulty. Since receiving the injection, he reports his L arm continues to gradually get better.    How long can you sit comfortably? no issue    How long can you stand comfortably? no issue    How long can you walk comfortably? no issue    Patient Stated Goals Not to have any issue at all if posible, but very pleased with how he is doing now.    Currently in Pain? No/denies    Multiple Pain Sites No              [facility] PT Assessment - 12/26/19 0001      Assessment   Medical Diagnosis Left upper arm pain    Referring Provider (PT) Gregor Hams, MD    Onset Date/Surgical Date --   Feb 2021   Hand Dominance Right    Prior Therapy No       Precautions   Precautions None      Restrictions   Weight Bearing Restrictions No      Balance Screen   Has the patient fallen in the past 6 months No      [place] residence    Living Arrangements Alone    Type of [place] to enter   3   Entrance Stairs-Number of Steps 3    [place] One level      Prior Function   Level of Independence Independent    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      Strength   Overall Strength Comments L UE demonstrates 5/5 strength for the shoulder, elbow, wrist and fingers      Palpation   Palpation comment Mild TTP to lateral mis upper arm      Special Tests    Special Tests Rotator Cuff Impingement    Other special tests Radial nerve test was negative    Rotator Cuff Impingment tests Michel Bickers test;Empty Can test;Full Can test      Hawkins-Kennedy test   Findings Negative    Side Left      Empty Can test   Findings Negative    Side Left      Full Can test   Findings Negative    Side Left      Transfers   Transfers Sit to Stand;Stand to Sit    Sit to Stand 7: Independent      Ambulation/Gait   Ambulation/Gait Yes    Ambulation/Gait Assistance 7: Independent    Gait Pattern Within Functional Limits                      Objective measurements completed on examination: See above findings.                 PT Short Term Goals - 12/26/19 1333      PT SHORT TERM GOAL #1   Title Pt  will continue at current level of function for his L UE.    Status Achieved    Target Date 12/26/19             PT Long Term Goals - 12/26/19 1333      PT LONG TERM GOAL #1   Title NA- Eval only                  Plan - 12/26/19 1335    Clinical Impression Statement Pt presents with his L arm pain improving siginificantly following a kenalog/lidocaine injection 10/8 by Dr. Georgina Snell after experiencing pain for 8 months . ROM, strength and radial nerve testing were WNLs and did not reproduce pt's pain. With pt's L arm pain significantly improved, his feeling it is continuing to improve, and his returning to higher level functional activities without difficulty, PT was not recommended at this time. If his condition wrosens, it was recommended he reschedule with Dr. Georgina Snell who would determine if a referral back to PT was appropriate at that time.    Stability/Clinical Decision Making Stable/Uncomplicated    Clinical Decision Making Low    Rehab Potential Excellent    Consulted and Agree with Plan of Care Patient           Patient will benefit from skilled therapeutic intervention in order to improve the following deficits and impairments:     Visit Diagnosis: Pain in left arm     Problem List There are no problems to display for this patient.   Shelly Spenser MS, PT 12/26/19 3:49 PM    PHYSICAL THERAPY DISCHARGE SUMMARY  Visits from Start of Care: 1 Eval only  Current functional level related to goals / functional outcomes: See above  Remaining deficits: See above  Education / Equipment: NA Plan: Patient agrees to discharge.  Patient goals were met. Patient is being discharged due to being pleased with the current functional level.  ?????      [place],  [place], [address] Phone: [phone]   Fax:  [phone]  Name: Colin Robbins MRN: [id number] Date of Birth: Dec 22, 1931

## 2020-01-17 DIAGNOSIS — H43393 Other vitreous opacities, bilateral: Secondary | ICD-10-CM | POA: Diagnosis not present

## 2020-01-17 DIAGNOSIS — H26493 Other secondary cataract, bilateral: Secondary | ICD-10-CM | POA: Diagnosis not present

## 2020-01-17 DIAGNOSIS — Z961 Presence of intraocular lens: Secondary | ICD-10-CM | POA: Diagnosis not present

## 2020-01-17 DIAGNOSIS — H40023 Open angle with borderline findings, high risk, bilateral: Secondary | ICD-10-CM | POA: Diagnosis not present

## 2020-01-24 DIAGNOSIS — E039 Hypothyroidism, unspecified: Secondary | ICD-10-CM | POA: Diagnosis not present

## 2020-01-24 DIAGNOSIS — I10 Essential (primary) hypertension: Secondary | ICD-10-CM | POA: Diagnosis not present

## 2020-01-24 DIAGNOSIS — E7800 Pure hypercholesterolemia, unspecified: Secondary | ICD-10-CM | POA: Diagnosis not present

## 2020-01-24 DIAGNOSIS — R7301 Impaired fasting glucose: Secondary | ICD-10-CM | POA: Diagnosis not present

## 2020-01-24 DIAGNOSIS — Z0000 Encounter for general adult medical examination without abnormal findings: Secondary | ICD-10-CM | POA: Diagnosis not present

## 2020-01-31 DIAGNOSIS — R2681 Unsteadiness on feet: Secondary | ICD-10-CM | POA: Diagnosis not present

## 2020-01-31 DIAGNOSIS — I10 Essential (primary) hypertension: Secondary | ICD-10-CM | POA: Diagnosis not present

## 2020-01-31 DIAGNOSIS — R011 Cardiac murmur, unspecified: Secondary | ICD-10-CM | POA: Diagnosis not present

## 2020-02-08 DIAGNOSIS — L570 Actinic keratosis: Secondary | ICD-10-CM | POA: Diagnosis not present

## 2020-02-08 DIAGNOSIS — L280 Lichen simplex chronicus: Secondary | ICD-10-CM | POA: Diagnosis not present

## 2020-02-08 DIAGNOSIS — D0421 Carcinoma in situ of skin of right ear and external auricular canal: Secondary | ICD-10-CM | POA: Diagnosis not present

## 2020-02-08 DIAGNOSIS — L821 Other seborrheic keratosis: Secondary | ICD-10-CM | POA: Diagnosis not present

## 2020-02-08 DIAGNOSIS — C44529 Squamous cell carcinoma of skin of other part of trunk: Secondary | ICD-10-CM | POA: Diagnosis not present

## 2020-02-08 DIAGNOSIS — L814 Other melanin hyperpigmentation: Secondary | ICD-10-CM | POA: Diagnosis not present

## 2020-02-08 DIAGNOSIS — D225 Melanocytic nevi of trunk: Secondary | ICD-10-CM | POA: Diagnosis not present

## 2020-07-16 DIAGNOSIS — H40023 Open angle with borderline findings, high risk, bilateral: Secondary | ICD-10-CM | POA: Diagnosis not present

## 2020-07-16 DIAGNOSIS — H26493 Other secondary cataract, bilateral: Secondary | ICD-10-CM | POA: Diagnosis not present

## 2020-07-31 DIAGNOSIS — R7301 Impaired fasting glucose: Secondary | ICD-10-CM | POA: Diagnosis not present

## 2020-07-31 DIAGNOSIS — E7800 Pure hypercholesterolemia, unspecified: Secondary | ICD-10-CM | POA: Diagnosis not present

## 2020-07-31 DIAGNOSIS — I10 Essential (primary) hypertension: Secondary | ICD-10-CM | POA: Diagnosis not present

## 2020-07-31 DIAGNOSIS — E039 Hypothyroidism, unspecified: Secondary | ICD-10-CM | POA: Diagnosis not present

## 2020-07-31 DIAGNOSIS — Z0000 Encounter for general adult medical examination without abnormal findings: Secondary | ICD-10-CM | POA: Diagnosis not present

## 2020-08-07 DIAGNOSIS — R011 Cardiac murmur, unspecified: Secondary | ICD-10-CM | POA: Diagnosis not present

## 2020-08-07 DIAGNOSIS — R2681 Unsteadiness on feet: Secondary | ICD-10-CM | POA: Diagnosis not present

## 2020-08-07 DIAGNOSIS — E7800 Pure hypercholesterolemia, unspecified: Secondary | ICD-10-CM | POA: Diagnosis not present

## 2020-08-07 DIAGNOSIS — Z0000 Encounter for general adult medical examination without abnormal findings: Secondary | ICD-10-CM | POA: Diagnosis not present

## 2020-08-07 DIAGNOSIS — I10 Essential (primary) hypertension: Secondary | ICD-10-CM | POA: Diagnosis not present

## 2020-08-07 DIAGNOSIS — E039 Hypothyroidism, unspecified: Secondary | ICD-10-CM | POA: Diagnosis not present

## 2020-08-08 DIAGNOSIS — D225 Melanocytic nevi of trunk: Secondary | ICD-10-CM | POA: Diagnosis not present

## 2020-08-08 DIAGNOSIS — L821 Other seborrheic keratosis: Secondary | ICD-10-CM | POA: Diagnosis not present

## 2020-08-08 DIAGNOSIS — D3617 Benign neoplasm of peripheral nerves and autonomic nervous system of trunk, unspecified: Secondary | ICD-10-CM | POA: Diagnosis not present

## 2020-08-08 DIAGNOSIS — L570 Actinic keratosis: Secondary | ICD-10-CM | POA: Diagnosis not present

## 2020-08-08 DIAGNOSIS — Z85828 Personal history of other malignant neoplasm of skin: Secondary | ICD-10-CM | POA: Diagnosis not present

## 2020-08-08 DIAGNOSIS — L814 Other melanin hyperpigmentation: Secondary | ICD-10-CM | POA: Diagnosis not present

## 2020-08-08 DIAGNOSIS — C4442 Squamous cell carcinoma of skin of scalp and neck: Secondary | ICD-10-CM | POA: Diagnosis not present

## 2020-08-21 DIAGNOSIS — I10 Essential (primary) hypertension: Secondary | ICD-10-CM | POA: Diagnosis not present

## 2020-08-21 DIAGNOSIS — E7800 Pure hypercholesterolemia, unspecified: Secondary | ICD-10-CM | POA: Diagnosis not present

## 2020-12-17 DIAGNOSIS — Z23 Encounter for immunization: Secondary | ICD-10-CM | POA: Diagnosis not present

## 2021-01-22 DIAGNOSIS — Z961 Presence of intraocular lens: Secondary | ICD-10-CM | POA: Diagnosis not present

## 2021-01-22 DIAGNOSIS — H35013 Changes in retinal vascular appearance, bilateral: Secondary | ICD-10-CM | POA: Diagnosis not present

## 2021-01-22 DIAGNOSIS — H04123 Dry eye syndrome of bilateral lacrimal glands: Secondary | ICD-10-CM | POA: Diagnosis not present

## 2021-01-22 DIAGNOSIS — H40023 Open angle with borderline findings, high risk, bilateral: Secondary | ICD-10-CM | POA: Diagnosis not present

## 2021-01-22 DIAGNOSIS — H524 Presbyopia: Secondary | ICD-10-CM | POA: Diagnosis not present

## 2021-02-10 DIAGNOSIS — D225 Melanocytic nevi of trunk: Secondary | ICD-10-CM | POA: Diagnosis not present

## 2021-02-10 DIAGNOSIS — L814 Other melanin hyperpigmentation: Secondary | ICD-10-CM | POA: Diagnosis not present

## 2021-02-10 DIAGNOSIS — L570 Actinic keratosis: Secondary | ICD-10-CM | POA: Diagnosis not present

## 2021-02-10 DIAGNOSIS — L821 Other seborrheic keratosis: Secondary | ICD-10-CM | POA: Diagnosis not present

## 2021-02-10 DIAGNOSIS — Z85828 Personal history of other malignant neoplasm of skin: Secondary | ICD-10-CM | POA: Diagnosis not present

## 2021-02-10 DIAGNOSIS — D485 Neoplasm of uncertain behavior of skin: Secondary | ICD-10-CM | POA: Diagnosis not present

## 2021-02-12 DIAGNOSIS — H35013 Changes in retinal vascular appearance, bilateral: Secondary | ICD-10-CM | POA: Diagnosis not present

## 2021-02-12 DIAGNOSIS — H04123 Dry eye syndrome of bilateral lacrimal glands: Secondary | ICD-10-CM | POA: Diagnosis not present

## 2021-02-12 DIAGNOSIS — H40023 Open angle with borderline findings, high risk, bilateral: Secondary | ICD-10-CM | POA: Diagnosis not present

## 2021-02-13 DIAGNOSIS — I10 Essential (primary) hypertension: Secondary | ICD-10-CM | POA: Diagnosis not present

## 2021-02-13 DIAGNOSIS — E7800 Pure hypercholesterolemia, unspecified: Secondary | ICD-10-CM | POA: Diagnosis not present

## 2021-02-13 DIAGNOSIS — E039 Hypothyroidism, unspecified: Secondary | ICD-10-CM | POA: Diagnosis not present

## 2021-02-20 DIAGNOSIS — R011 Cardiac murmur, unspecified: Secondary | ICD-10-CM | POA: Diagnosis not present

## 2021-02-20 DIAGNOSIS — N1831 Chronic kidney disease, stage 3a: Secondary | ICD-10-CM | POA: Diagnosis not present

## 2021-02-20 DIAGNOSIS — E039 Hypothyroidism, unspecified: Secondary | ICD-10-CM | POA: Diagnosis not present

## 2021-02-20 DIAGNOSIS — I10 Essential (primary) hypertension: Secondary | ICD-10-CM | POA: Diagnosis not present

## 2021-02-20 DIAGNOSIS — Z23 Encounter for immunization: Secondary | ICD-10-CM | POA: Diagnosis not present

## 2021-02-20 DIAGNOSIS — E7800 Pure hypercholesterolemia, unspecified: Secondary | ICD-10-CM | POA: Diagnosis not present

## 2021-02-20 DIAGNOSIS — K219 Gastro-esophageal reflux disease without esophagitis: Secondary | ICD-10-CM | POA: Diagnosis not present

## 2021-02-20 DIAGNOSIS — Q253 Supravalvular aortic stenosis: Secondary | ICD-10-CM | POA: Diagnosis not present

## 2021-05-19 ENCOUNTER — Other Ambulatory Visit: Payer: Self-pay

## 2021-05-19 ENCOUNTER — Ambulatory Visit: Payer: Medicare Other | Admitting: Podiatry

## 2021-05-19 DIAGNOSIS — L989 Disorder of the skin and subcutaneous tissue, unspecified: Secondary | ICD-10-CM | POA: Diagnosis not present

## 2021-05-19 NOTE — Progress Notes (Signed)
? ?  Subjective: ?88 y.o. male presenting to the office today as a new patient for evaluation of pain and tenderness to the right plantar heel.  Patient states that he noticed it about 1 month ago.  It is very tender to walk and he says that there is a spot that if he walks on a certain way it elicits a significant amount of pain.  He presents for further treatment and evaluation ? ?No past medical history on file. ?No past surgical history on file. ?No Known Allergies ? ? ?Objective:  ?Physical Exam ?General: Alert and oriented x3 in no acute distress ? ?Dermatology: Hyperkeratotic skin lesion(s) present on the plantar aspect of the right heel. Pain on palpation with a central nucleated core noted. Skin is warm, dry and supple bilateral lower extremities. Negative for open lesions or macerations. ? ?Vascular: Palpable pedal pulses bilaterally. No edema or erythema noted. Capillary refill within normal limits. ? ?Neurological: Epicritic and protective threshold grossly intact bilaterally.  ? ?Musculoskeletal Exam: Pain on palpation at the keratotic lesion(s) noted. Range of motion within normal limits bilateral. Muscle strength 5/5 in all groups bilateral. ? ?Assessment: ?1.  Benign hyperkeratotic skin lesion right plantar heel ? ? ?Plan of Care:  ?1. Patient evaluated ?2. Excisional debridement of keratoic lesion(s) using a chisel blade was performed without incident.  ?3.  The patient felt immediate relief.  Recommend good foot hygiene and advised against going barefoot ?4. Patient is to return to the clinic PRN.  ? ?Edrick Kins, DPM ?[facility] ? ?Dr. Edrick Kins, DPM  ?  ?[address]                                       ?[place], [place] [address]                ?Office [phone]  ?Fax [phone] ? ? ? ? ?

## 2021-06-05 ENCOUNTER — Other Ambulatory Visit: Payer: Self-pay

## 2021-06-05 ENCOUNTER — Emergency Department (HOSPITAL_BASED_OUTPATIENT_CLINIC_OR_DEPARTMENT_OTHER): Payer: Medicare Other

## 2021-06-05 ENCOUNTER — Emergency Department (HOSPITAL_BASED_OUTPATIENT_CLINIC_OR_DEPARTMENT_OTHER)
Admission: EM | Admit: 2021-06-05 | Discharge: 2021-06-05 | Disposition: A | Discharge status: Home / Self Care | Payer: Medicare Other | Attending: Emergency Medicine | Admitting: Emergency Medicine

## 2021-06-05 ENCOUNTER — Encounter (HOSPITAL_BASED_OUTPATIENT_CLINIC_OR_DEPARTMENT_OTHER): Payer: Self-pay | Admitting: Emergency Medicine

## 2021-06-05 DIAGNOSIS — N5089 Other specified disorders of the male genital organs: Secondary | ICD-10-CM | POA: Insufficient documentation

## 2021-06-05 DIAGNOSIS — Z79899 Other long term (current) drug therapy: Secondary | ICD-10-CM | POA: Insufficient documentation

## 2021-06-05 DIAGNOSIS — E876 Hypokalemia: Secondary | ICD-10-CM

## 2021-06-05 DIAGNOSIS — N451 Epididymitis: Secondary | ICD-10-CM

## 2021-06-05 DIAGNOSIS — Z794 Long term (current) use of insulin: Secondary | ICD-10-CM | POA: Diagnosis not present

## 2021-06-05 LAB — COMPREHENSIVE METABOLIC PANEL
ALT: 11 U/L (ref 0–44)
AST: 19 U/L (ref 15–41)
Albumin: 4.5 g/dL (ref 3.5–5.0)
Alkaline Phosphatase: 56 U/L (ref 38–126)
Anion gap: 9 (ref 5–15)
BUN: 21 mg/dL (ref 8–23)
CO2: 27 mmol/L (ref 22–32)
Calcium: 9.8 mg/dL (ref 8.9–10.3)
Chloride: 100 mmol/L (ref 98–111)
Creatinine, Ser: 1.23 mg/dL (ref 0.61–1.24)
GFR, Estimated: 57 mL/min — ABNORMAL LOW (ref >60)
Glucose, Bld: 101 mg/dL — ABNORMAL HIGH (ref 70–99)
Potassium: 2.8 mmol/L — ABNORMAL LOW (ref 3.5–5.1)
Sodium: 136 mmol/L (ref 135–145)
Total Bilirubin: 0.7 mg/dL (ref 0.3–1.2)
Total Protein: 7.8 g/dL (ref 6.5–8.1)

## 2021-06-05 LAB — CBC WITH DIFFERENTIAL/PLATELET
Abs Immature Granulocytes: 0.03 10*3/uL (ref 0.00–0.07)
Basophils Absolute: 0 10*3/uL (ref 0.0–0.1)
Basophils Relative: 0 %
Eosinophils Absolute: 0.2 10*3/uL (ref 0.0–0.5)
Eosinophils Relative: 2 %
HCT: 40 % (ref 39.0–52.0)
Hemoglobin: 13.1 g/dL (ref 13.0–17.0)
Immature Granulocytes: 0 %
Lymphocytes Relative: 12 %
Lymphs Abs: 1.1 10*3/uL (ref 0.7–4.0)
MCH: 29.2 pg (ref 26.0–34.0)
MCHC: 32.8 g/dL (ref 30.0–36.0)
MCV: 89.1 fL (ref 80.0–100.0)
Monocytes Absolute: 0.8 10*3/uL (ref 0.1–1.0)
Monocytes Relative: 9 %
Neutro Abs: 7 10*3/uL (ref 1.7–7.7)
Neutrophils Relative %: 77 %
Platelets: 154 10*3/uL (ref 150–400)
RBC: 4.49 MIL/uL (ref 4.22–5.81)
RDW: 13.2 % (ref 11.5–15.5)
WBC: 9.2 10*3/uL (ref 4.0–10.5)
nRBC: 0 % (ref 0.0–0.2)

## 2021-06-05 LAB — URINALYSIS, ROUTINE W REFLEX MICROSCOPIC
Bilirubin Urine: NEGATIVE
Glucose, UA: NEGATIVE mg/dL
Nitrite: NEGATIVE
Protein, ur: 100 mg/dL — AB
Specific Gravity, Urine: 1.036 — ABNORMAL HIGH (ref 1.005–1.030)
WBC, UA: >50 WBC/hpf — ABNORMAL HIGH (ref 0–5)
pH: 5.5 (ref 5.0–8.0)

## 2021-06-05 MED ORDER — CIPROFLOXACIN HCL 500 MG PO TABS: 500.0000 mg | ORAL_TABLET | Freq: Two times a day (BID) | ORAL | 0 refills | Status: DC

## 2021-06-05 MED ORDER — SODIUM CHLORIDE 0.9 % IV SOLN
2.0000 g | Freq: Once | INTRAVENOUS | Status: AC
  Administered 2021-06-05: 2 g via INTRAVENOUS
  Filled 2021-06-05: qty 20

## 2021-06-05 MED ORDER — CIPROFLOXACIN IN D5W 400 MG/200ML IV SOLN: 400.0000 mg | Freq: Once | INTRAVENOUS | Status: DC

## 2021-06-05 MED ORDER — SODIUM CHLORIDE 0.9 % IV SOLN: 1.0000 g | Freq: Once | INTRAVENOUS | Status: DC

## 2021-06-05 MED ORDER — POTASSIUM CHLORIDE CRYS ER 20 MEQ PO TBCR
40.0000 meq | EXTENDED_RELEASE_TABLET | Freq: Once | ORAL | Status: AC
  Administered 2021-06-05: 40 meq via ORAL
  Filled 2021-06-05: qty 2

## 2021-06-05 MED ORDER — MORPHINE SULFATE (PF) 4 MG/ML IV SOLN
4.0000 mg | Freq: Once | INTRAVENOUS | Status: AC
Start: 2021-06-05 — End: 2021-06-05
  Administered 2021-06-05: 4 mg via INTRAVENOUS
  Filled 2021-06-05: qty 1

## 2021-06-05 MED ORDER — POTASSIUM CHLORIDE CRYS ER 20 MEQ PO TBCR: 20.0000 meq | EXTENDED_RELEASE_TABLET | Freq: Every day | ORAL | 0 refills | Status: DC

## 2021-06-05 NOTE — ED Triage Notes (Signed)
Pt complains of testicle swelling for the past 4 -5 days. NO problem with urination. Pt states he is having trouble walking around due to swelling.  ?

## 2021-06-05 NOTE — Discharge Instructions (Addendum)
You have epididymitis with a possible abscess or mass ? ?Urology wants to see you at 8 AM tomorrow in his office.  Please do not eat or drink anything after midnight ? ?Your potassium is low so please take potassium as prescribed ? ?You also need to take Cipro twice a day for a week ? ?Return to ER if you have worse testicular pain, fever, vomiting ?

## 2021-06-05 NOTE — ED Notes (Signed)
Patient transported to CT 

## 2021-06-05 NOTE — ED Provider Notes (Signed)
?[facility] EMERGENCY DEPT ?Provider Note ? ? ?CSN: [id number] ?Arrival date & time: 06/05/21  1435 ? ?  ? ?History ? ?Chief Complaint  ?Patient presents with  ? Groin Swelling  ? ? ?Colin Robbins is a 88 y.o. male who presented with right testicular swelling.  Patient states that he does a lot of yard work.  He states that for the last 4 to 5 days he noticed progressive swelling of the right scrotal area.  Patient denies any trouble urinating.  Denies any history of hernias ? ?The history is provided by the patient.  ? ?  ? ?Home Medications ?Prior to Admission medications   ?Medication Sig Start Date End Date Taking? Authorizing Provider  ?aspirin EC 81 MG tablet Take 81 mg by mouth daily. Swallow whole.    [provider]  ?gabapentin (NEURONTIN) 100 MG capsule Take 1-3 capsules (100-300 mg total) by mouth at bedtime as needed (nerve pain). ?Patient not taking: Reported on 12/26/2019 10/16/19   Gregor Hams, MD  ?multivitamin-iron-minerals-folic acid (CENTRUM) chewable tablet Chew 1 tablet by mouth daily.    [provider]  ?rosuvastatin (CRESTOR) 20 MG tablet Take 20 mg by mouth daily.    [provider]  ?tamsulosin (FLOMAX) 0.4 MG CAPS capsule Take 0.4 mg by mouth.    [provider]  ?traMADol (ULTRAM) 50 MG tablet Take 50 mg by mouth 3 (three) times daily as needed. ?Patient not taking: Reported on 12/26/2019 10/11/19   [provider]  ?   ? ?Allergies    ?Patient has no known allergies.   ? ?Review of Systems   ?Review of Systems  ?Genitourinary:  Positive for scrotal swelling.  ?All other systems reviewed and are negative. ? ?Physical Exam ?Updated Vital Signs ?BP (!) 161/82   Pulse 69   Temp 98 ?F (36.7 ?C)   Resp 16   Ht 6' (1.829 m)   Wt 77.1 kg   SpO2 97%   BMI 23.06 kg/m?  ?Physical Exam ?Vitals and nursing note reviewed.  ?Constitutional:   ?   Comments: Uncomfortable  ?HENT:  ?   Head: Normocephalic.  ?   Nose: Nose normal.  ?    Mouth/Throat:  ?   Mouth: Mucous membranes are moist.  ?Eyes:  ?   Extraocular Movements: Extraocular movements intact.  ?   Pupils: Pupils are equal, round, and reactive to light.  ?Cardiovascular:  ?   Rate and Rhythm: Normal rate and regular rhythm.  ?   Pulses: Normal pulses.  ?   Heart sounds: Normal heart sounds.  ?Pulmonary:  ?   Effort: Pulmonary effort is normal.  ?   Breath sounds: Normal breath sounds.  ?Abdominal:  ?   General: Abdomen is flat.  ?   Palpations: Abdomen is soft.  ?Genitourinary: ?   Comments: Right scrotum is swelling, questionable right inguinal hernia. ?Musculoskeletal:     ?   General: Normal range of motion.  ?   Cervical back: Normal range of motion and neck supple.  ?Skin: ?   General: Skin is warm.  ?   Capillary Refill: Capillary refill takes less than 2 seconds.  ?Neurological:  ?   General: No focal deficit present.  ?Psychiatric:     ?   Mood and Affect: Mood normal.     ?   Behavior: Behavior normal.  ? ? ?ED Results / Procedures / Treatments   ?Labs ?(all labs ordered are listed, but only abnormal results  are displayed) ?Labs Reviewed  ?COMPREHENSIVE METABOLIC PANEL - Abnormal; Notable for the following components:  ?    Result Value  ? Potassium 2.8 (*)   ? Glucose, Bld 101 (*)   ? GFR, Estimated 57 (*)   ? All other components within normal limits  ?CBC WITH DIFFERENTIAL/PLATELET  ?URINALYSIS, ROUTINE W REFLEX MICROSCOPIC  ? ? ?EKG ?None ? ?Radiology ?No results found. ? ?Procedures ?Procedures  ? ? ?Medications Ordered in ED ?Medications  ?morphine (PF) 4 MG/ML injection 4 mg (4 mg Intravenous Given 06/05/21 1729)  ? ? ?ED Course/ Medical Decision Making/ A&P ?  ?                        ?Medical Decision Making ?Colin Robbins is a 88 y.o. male here presenting with right scrotal swelling. Concern for possible inguinal hernia versus orchitis or epididymitis.  Patient has no signs of Fournier's gangrene. Plan to get CBC and BMP and scrotal ultrasound. ?  ?7:01 PM ?Potassium  is 2.8 and was supplemented.  UA showed large leuks and > WBC.  Ultrasound showed hypervascular solid mass and there is a concern for possible orchitis or abscess.  I discussed case with Dr. Milford Cage from urology.  He will he reviewed the images and states that patient can get 2 g of Rocephin and be discharged home with Cipro.  He wants to see the patient at 8 AM tomorrow and wants patient n.p.o. after midnight. ? ?Problems Addressed: ?Epididymitis: acute illness or injury ?Testicular mass: acute illness or injury ? ?Amount and/or Complexity of Data Reviewed ?Labs: ordered. Decision-making details documented in ED Course. ?Radiology: ordered and independent interpretation performed. Decision-making details documented in ED Course. ? ?Risk ?Prescription drug management. ? ? ?Final Clinical Impression(s) / ED Diagnoses ?Final diagnoses:  ?None  ? ? ?Rx / DC Orders ?ED Discharge Orders   ? ? None  ? ?  ? ? ?  ?Drenda Freeze, MD ?06/05/21 1903 ? ?

## 2021-06-05 NOTE — ED Notes (Signed)
Patient verbalizes understanding of discharge instructions. Opportunity for questioning and answers were provided. Patient discharged from ED.  °

## 2021-06-08 LAB — URINE CULTURE: Culture: >=100000 — AB

## 2021-06-09 ENCOUNTER — Telehealth (HOSPITAL_BASED_OUTPATIENT_CLINIC_OR_DEPARTMENT_OTHER): Payer: Self-pay | Admitting: *Deleted

## 2021-06-09 NOTE — Telephone Encounter (Signed)
Post ED Visit - Positive Culture Follow-up ? ?Culture report reviewed by antimicrobial stewardship pharmacist: ?[place] Team ?'[x]'$  Mal Misty Dohlenr, Pharm.D. ?'[]'$  Heide Guile, Pharm.D., BCPS AQ-ID ?'[]'$  Parks Neptune, Pharm.D., BCPS ?'[]'$  Alycia Rossetti, Pharm.D., BCPS ?'[]'$  [place], Pharm.D., BCPS, AAHIVP ?'[]'$  Legrand Como, Pharm.D., BCPS, AAHIVP ?'[]'$  Salome Arnt, PharmD, BCPS ?'[]'$  Johnnette Gourd, PharmD, BCPS ?'[]'$  Hughes Better, PharmD, BCPS ?'[]'$  Leeroy Cha, PharmD ?'[]'$  Laqueta Linden, PharmD, BCPS ?'[]'$  Albertina Parr, PharmD ? ?[place] Team ?'[]'$  Leodis Sias, PharmD ?'[]'$  Lindell Spar, PharmD ?'[]'$  Royetta Asal, PharmD ?'[]'$  Graylin Shiver, Rph ?'[]'$  Rema Fendt) Glennon Mac, PharmD ?'[]'$  Arlyn Dunning, PharmD ?'[]'$  Netta Cedars, PharmD ?'[]'$  Dia Sitter, PharmD ?'[]'$  Leone Haven, PharmD ?'[]'$  Gretta Arab, PharmD ?'[]'$  Theodis Shove, PharmD ?'[]'$  Peggyann Juba, PharmD ?'[]'$  Reuel Boom, PharmD ? ? ?Positive urine culture ?Treated with Ciprofloxacin, organism sensitive to the same and no further patient follow-up is required at this time. ? ?Colin Robbins ?06/09/2021, 11:53 AM ?  ?

## 2021-07-30 DIAGNOSIS — I10 Essential (primary) hypertension: Secondary | ICD-10-CM | POA: Diagnosis not present

## 2021-07-30 DIAGNOSIS — E039 Hypothyroidism, unspecified: Secondary | ICD-10-CM | POA: Diagnosis not present

## 2021-07-30 DIAGNOSIS — N1831 Chronic kidney disease, stage 3a: Secondary | ICD-10-CM | POA: Diagnosis not present

## 2021-07-30 DIAGNOSIS — E7800 Pure hypercholesterolemia, unspecified: Secondary | ICD-10-CM | POA: Diagnosis not present

## 2021-08-06 DIAGNOSIS — K219 Gastro-esophageal reflux disease without esophagitis: Secondary | ICD-10-CM | POA: Diagnosis not present

## 2021-08-06 DIAGNOSIS — I10 Essential (primary) hypertension: Secondary | ICD-10-CM | POA: Diagnosis not present

## 2021-08-06 DIAGNOSIS — R011 Cardiac murmur, unspecified: Secondary | ICD-10-CM | POA: Diagnosis not present

## 2021-08-06 DIAGNOSIS — Z0000 Encounter for general adult medical examination without abnormal findings: Secondary | ICD-10-CM | POA: Diagnosis not present

## 2021-08-06 DIAGNOSIS — E039 Hypothyroidism, unspecified: Secondary | ICD-10-CM | POA: Diagnosis not present

## 2021-08-06 DIAGNOSIS — N1831 Chronic kidney disease, stage 3a: Secondary | ICD-10-CM | POA: Diagnosis not present

## 2021-08-06 DIAGNOSIS — Q253 Supravalvular aortic stenosis: Secondary | ICD-10-CM | POA: Diagnosis not present

## 2021-08-06 DIAGNOSIS — E7800 Pure hypercholesterolemia, unspecified: Secondary | ICD-10-CM | POA: Diagnosis not present

## 2021-08-07 DIAGNOSIS — D225 Melanocytic nevi of trunk: Secondary | ICD-10-CM | POA: Diagnosis not present

## 2021-08-07 DIAGNOSIS — Z85828 Personal history of other malignant neoplasm of skin: Secondary | ICD-10-CM | POA: Diagnosis not present

## 2021-08-07 DIAGNOSIS — D0439 Carcinoma in situ of skin of other parts of face: Secondary | ICD-10-CM | POA: Diagnosis not present

## 2021-08-07 DIAGNOSIS — L814 Other melanin hyperpigmentation: Secondary | ICD-10-CM | POA: Diagnosis not present

## 2021-08-07 DIAGNOSIS — D044 Carcinoma in situ of skin of scalp and neck: Secondary | ICD-10-CM | POA: Diagnosis not present

## 2021-08-07 DIAGNOSIS — L821 Other seborrheic keratosis: Secondary | ICD-10-CM | POA: Diagnosis not present

## 2021-12-24 DIAGNOSIS — Z23 Encounter for immunization: Secondary | ICD-10-CM | POA: Diagnosis not present

## 2022-01-14 DIAGNOSIS — H43393 Other vitreous opacities, bilateral: Secondary | ICD-10-CM | POA: Diagnosis not present

## 2022-01-14 DIAGNOSIS — H40023 Open angle with borderline findings, high risk, bilateral: Secondary | ICD-10-CM | POA: Diagnosis not present

## 2022-01-14 DIAGNOSIS — H04123 Dry eye syndrome of bilateral lacrimal glands: Secondary | ICD-10-CM | POA: Diagnosis not present

## 2022-01-14 DIAGNOSIS — H35013 Changes in retinal vascular appearance, bilateral: Secondary | ICD-10-CM | POA: Diagnosis not present

## 2022-01-14 DIAGNOSIS — H524 Presbyopia: Secondary | ICD-10-CM | POA: Diagnosis not present

## 2022-02-03 DIAGNOSIS — E7800 Pure hypercholesterolemia, unspecified: Secondary | ICD-10-CM | POA: Diagnosis not present

## 2022-02-03 DIAGNOSIS — N1831 Chronic kidney disease, stage 3a: Secondary | ICD-10-CM | POA: Diagnosis not present

## 2022-02-03 DIAGNOSIS — E039 Hypothyroidism, unspecified: Secondary | ICD-10-CM | POA: Diagnosis not present

## 2022-02-03 DIAGNOSIS — I10 Essential (primary) hypertension: Secondary | ICD-10-CM | POA: Diagnosis not present

## 2022-02-09 DIAGNOSIS — R011 Cardiac murmur, unspecified: Secondary | ICD-10-CM | POA: Diagnosis not present

## 2022-02-09 DIAGNOSIS — Q253 Supravalvular aortic stenosis: Secondary | ICD-10-CM | POA: Diagnosis not present

## 2022-02-09 DIAGNOSIS — E7800 Pure hypercholesterolemia, unspecified: Secondary | ICD-10-CM | POA: Diagnosis not present

## 2022-02-09 DIAGNOSIS — K219 Gastro-esophageal reflux disease without esophagitis: Secondary | ICD-10-CM | POA: Diagnosis not present

## 2022-02-09 DIAGNOSIS — I10 Essential (primary) hypertension: Secondary | ICD-10-CM | POA: Diagnosis not present

## 2022-02-09 DIAGNOSIS — N1831 Chronic kidney disease, stage 3a: Secondary | ICD-10-CM | POA: Diagnosis not present

## 2022-02-09 DIAGNOSIS — E039 Hypothyroidism, unspecified: Secondary | ICD-10-CM | POA: Diagnosis not present

## 2022-02-18 DIAGNOSIS — Q253 Supravalvular aortic stenosis: Secondary | ICD-10-CM | POA: Diagnosis not present

## 2022-07-14 DIAGNOSIS — H40023 Open angle with borderline findings, high risk, bilateral: Secondary | ICD-10-CM | POA: Diagnosis not present

## 2022-07-19 IMAGING — DX DG CERVICAL SPINE 2 OR 3 VIEWS
4 series · 4 of 4 positions shown · non-contrast
Comparison: None.

CLINICAL DATA: Left-sided radiculopathy with neck pain

EXAM:
CERVICAL SPINE - 3 VIEW

[c-spine lat]
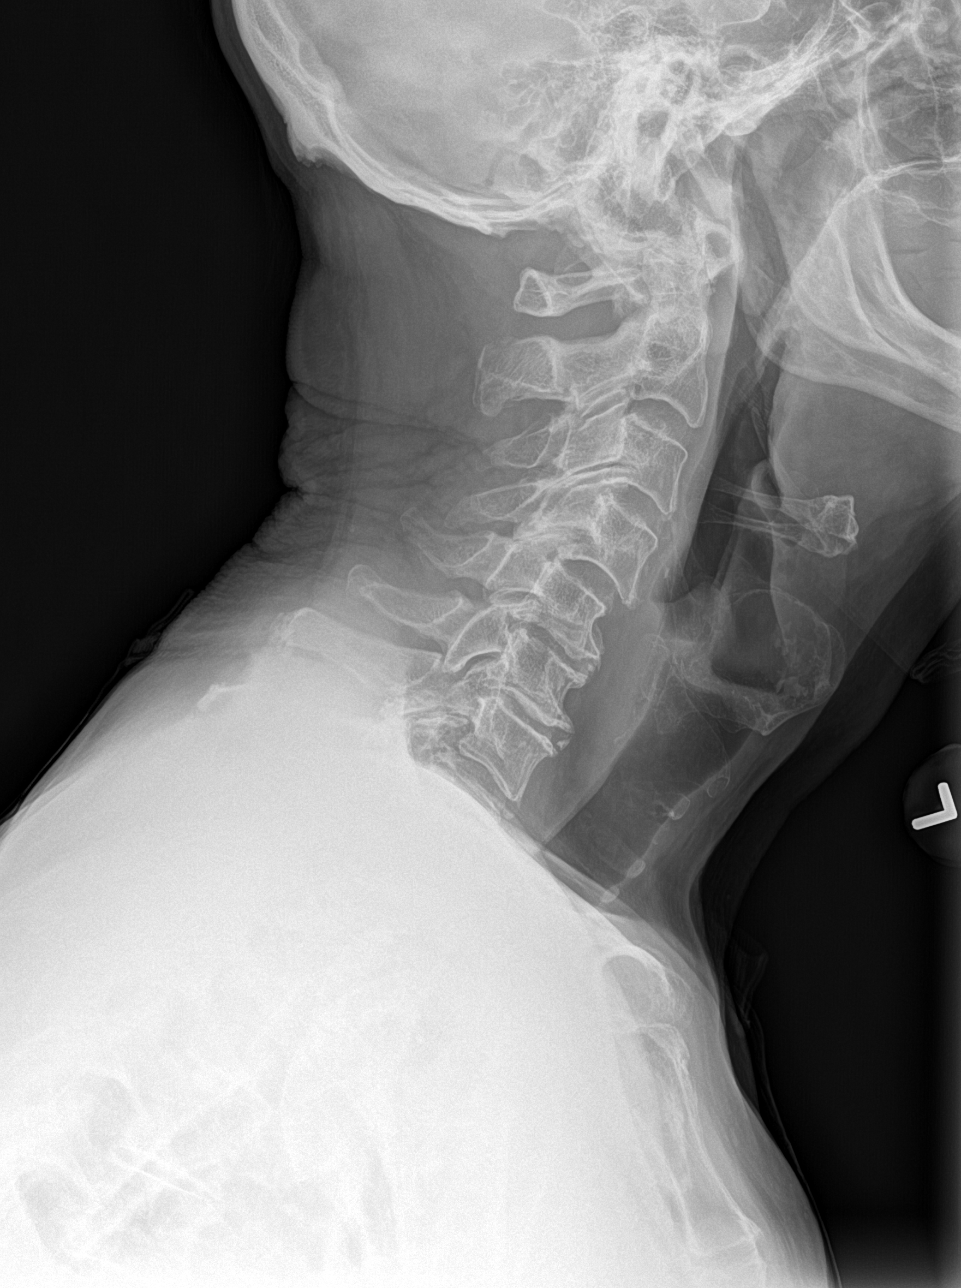

[c-spine ap]
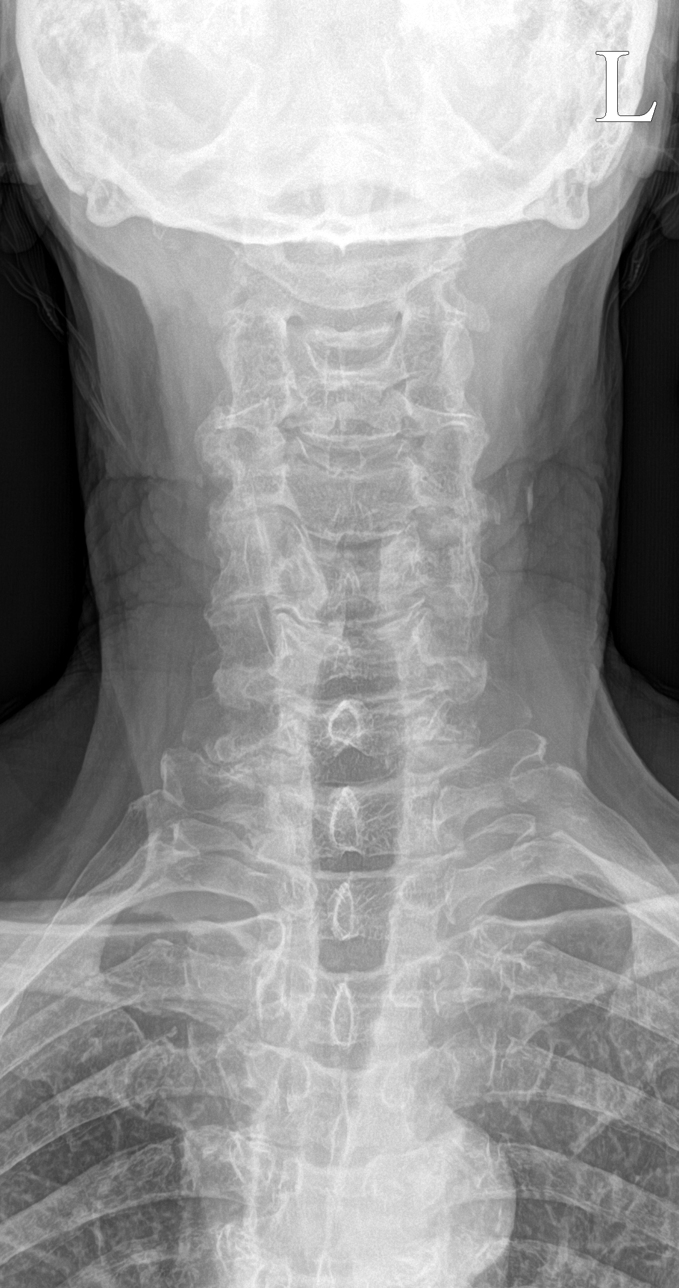

[ct-spine swimmers]
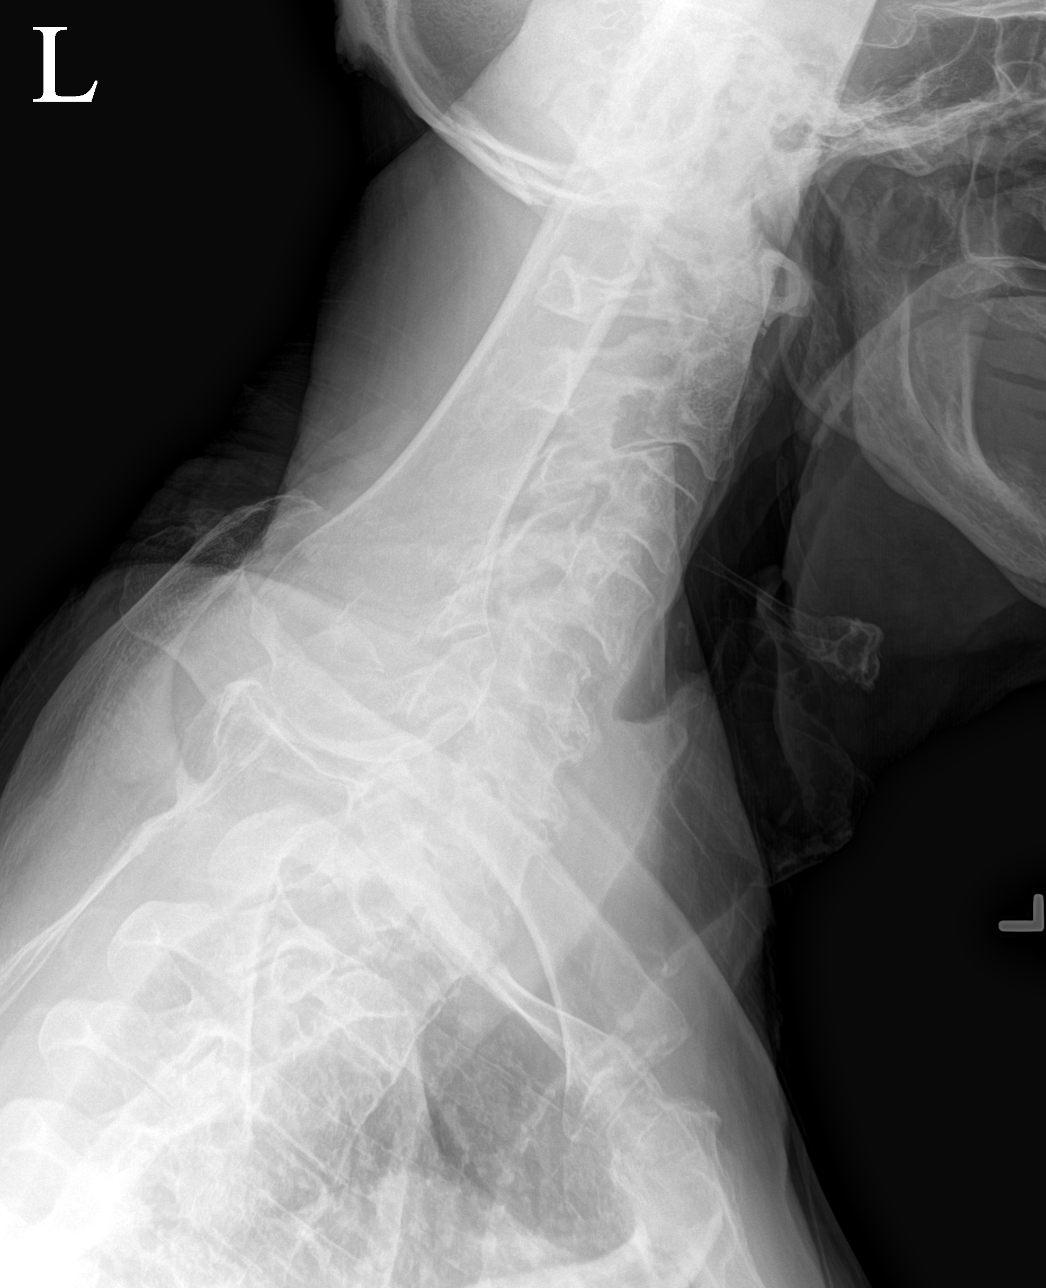

[c-spine open mouth]
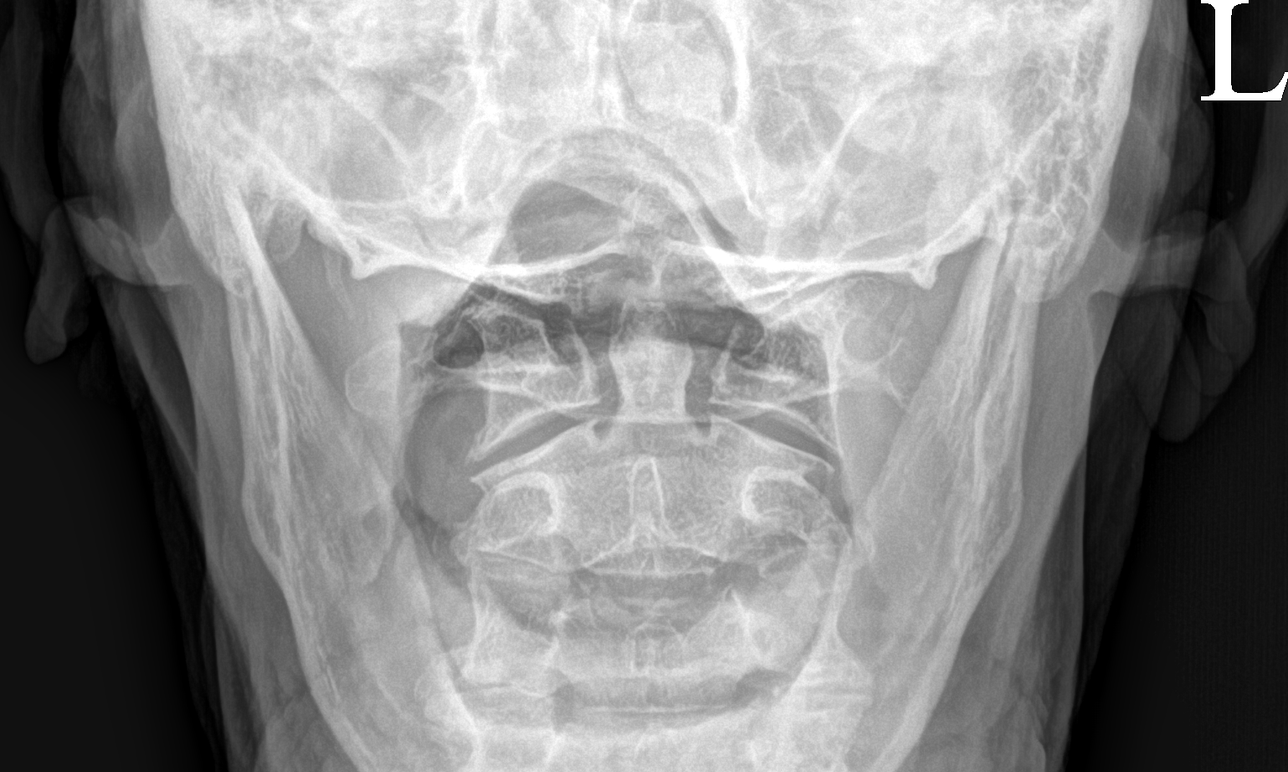

[4 of 4 positions shown; findings below may reference images not displayed]

FINDINGS: Seven cervical segments are well visualized. Vertebral body height
is well maintained. Disc space narrowing at C5-6 and C6-7 is noted.
No soft tissue abnormality is noted. Chronic calcifications are
noted on the left.
IMPRESSION: No acute abnormality noted.

## 2022-08-03 DIAGNOSIS — E7800 Pure hypercholesterolemia, unspecified: Secondary | ICD-10-CM | POA: Diagnosis not present

## 2022-08-03 DIAGNOSIS — E039 Hypothyroidism, unspecified: Secondary | ICD-10-CM | POA: Diagnosis not present

## 2022-08-03 DIAGNOSIS — N1831 Chronic kidney disease, stage 3a: Secondary | ICD-10-CM | POA: Diagnosis not present

## 2022-08-10 DIAGNOSIS — N1831 Chronic kidney disease, stage 3a: Secondary | ICD-10-CM | POA: Diagnosis not present

## 2022-08-10 DIAGNOSIS — R011 Cardiac murmur, unspecified: Secondary | ICD-10-CM | POA: Diagnosis not present

## 2022-08-10 DIAGNOSIS — I10 Essential (primary) hypertension: Secondary | ICD-10-CM | POA: Diagnosis not present

## 2022-08-10 DIAGNOSIS — Q253 Supravalvular aortic stenosis: Secondary | ICD-10-CM | POA: Diagnosis not present

## 2022-08-10 DIAGNOSIS — E7800 Pure hypercholesterolemia, unspecified: Secondary | ICD-10-CM | POA: Diagnosis not present

## 2022-08-10 DIAGNOSIS — G250 Essential tremor: Secondary | ICD-10-CM | POA: Diagnosis not present

## 2022-08-10 DIAGNOSIS — E039 Hypothyroidism, unspecified: Secondary | ICD-10-CM | POA: Diagnosis not present

## 2022-08-10 DIAGNOSIS — K219 Gastro-esophageal reflux disease without esophagitis: Secondary | ICD-10-CM | POA: Diagnosis not present

## 2022-08-10 DIAGNOSIS — Z91030 Bee allergy status: Secondary | ICD-10-CM | POA: Diagnosis not present

## 2022-08-10 DIAGNOSIS — Z0000 Encounter for general adult medical examination without abnormal findings: Secondary | ICD-10-CM | POA: Diagnosis not present

## 2022-09-03 IMAGING — DX DG SHOULDER 2+V*L*
3 series · 3 of 3 positions shown · non-contrast
Comparison: None.

CLINICAL DATA: Left shoulder pain.

EXAM:
LEFT SHOULDER - 2+ VIEW

[shoulder ap (1 of 2)]
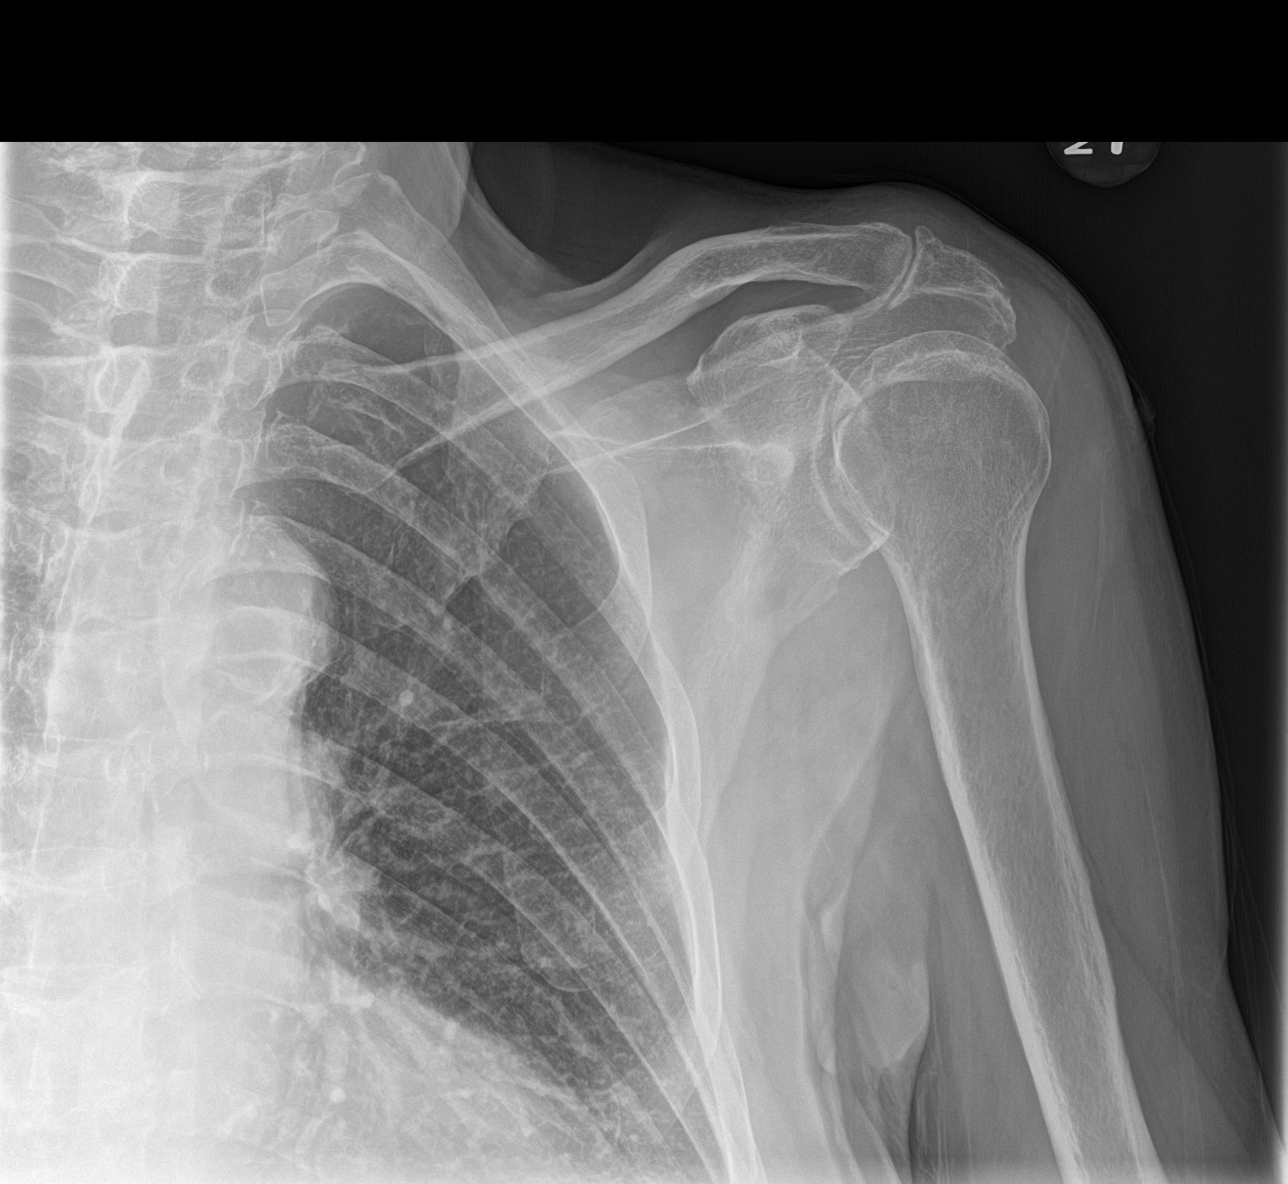

[shoulder ap (2 of 2)]
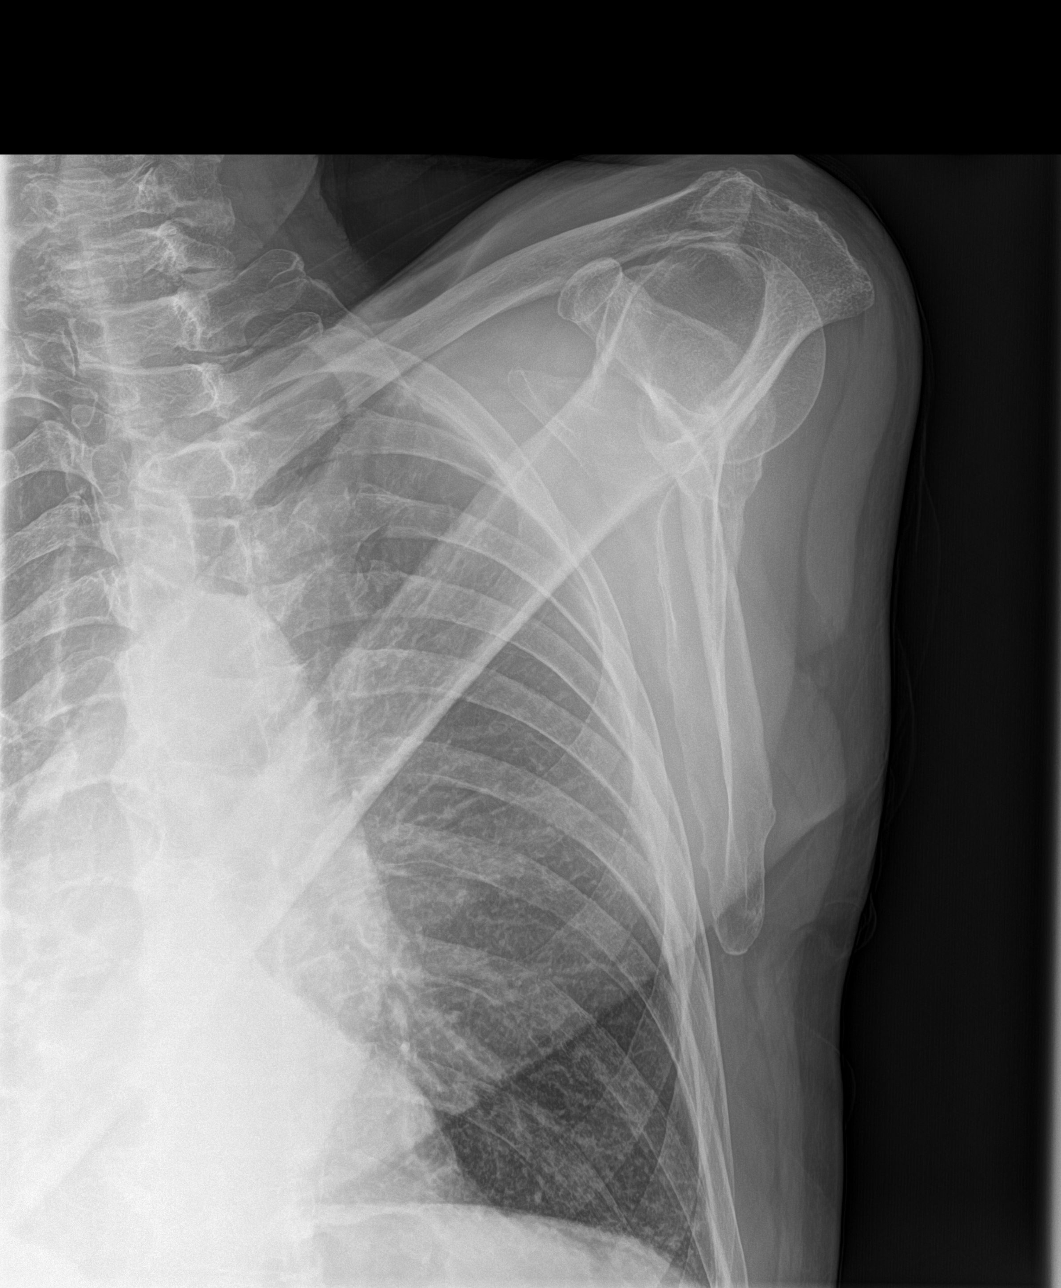

[shoulder axial]
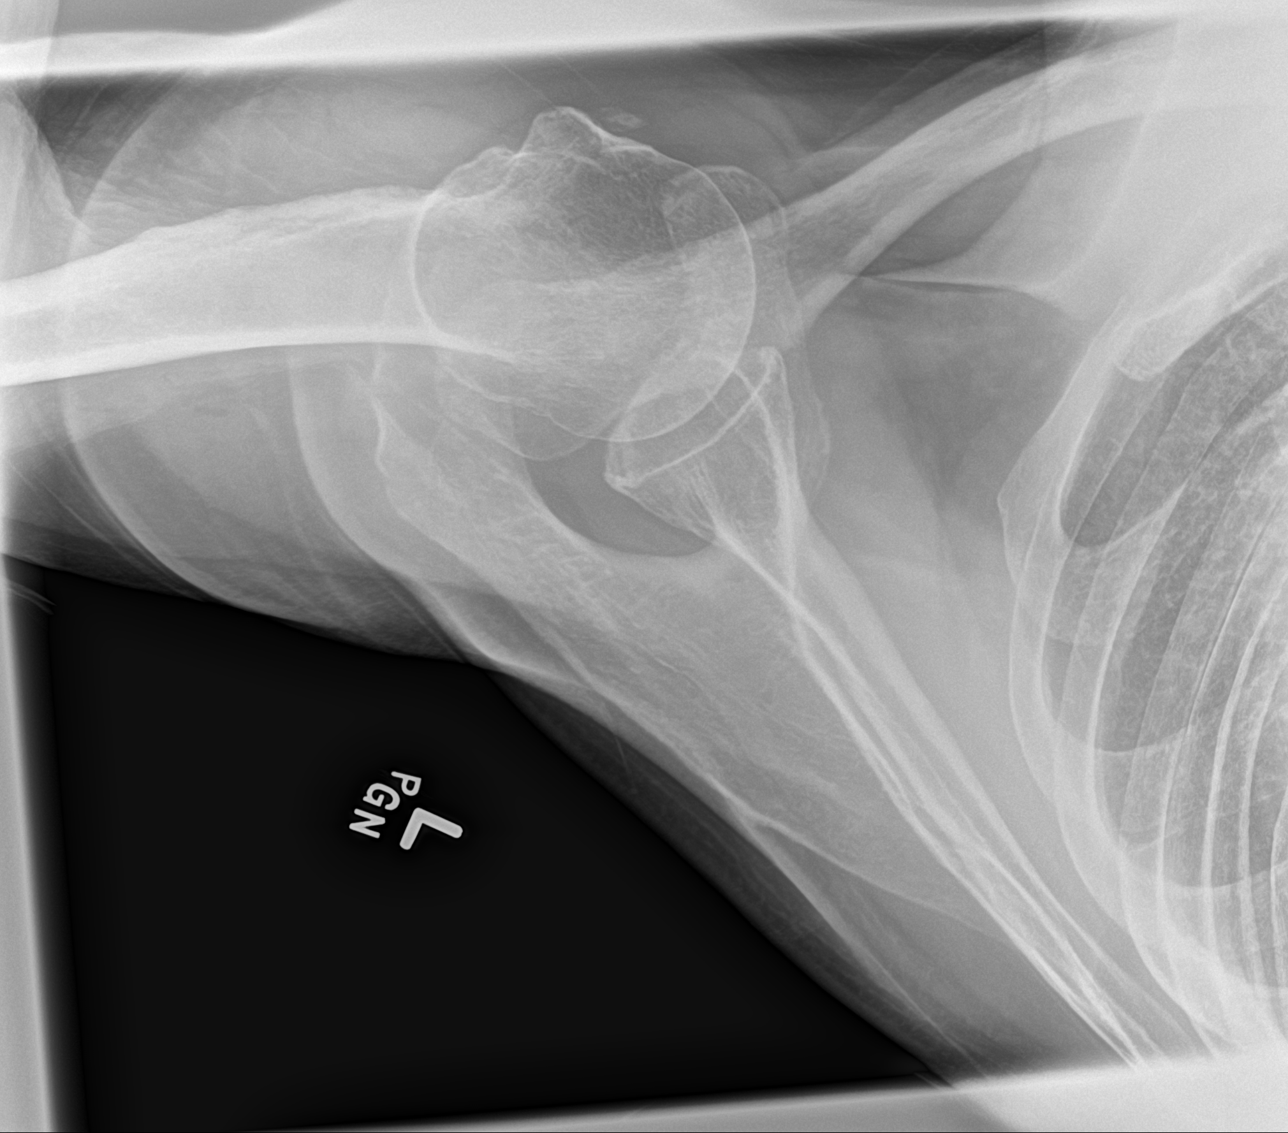

[3 of 3 positions shown; findings below may reference images not displayed]

FINDINGS: There is no evidence of fracture or dislocation. Moderate
degenerative changes seen involving the left glenohumeral joint.
Severe degenerative changes seen involving the left
acromioclavicular joint. Soft tissues are unremarkable.
IMPRESSION: Degenerative joint disease of the left glenohumeral and
acromioclavicular joints. No acute abnormality seen in the left
shoulder.

## 2022-10-06 ENCOUNTER — Ambulatory Visit: Payer: Medicare Other | Attending: Cardiovascular Disease | Admitting: Cardiovascular Disease

## 2022-10-06 ENCOUNTER — Encounter: Payer: Self-pay | Admitting: Cardiovascular Disease

## 2022-10-06 VITALS — BP 139/81 | HR 69 | Ht 72.0 in | Wt 168.4 lb

## 2022-10-06 DIAGNOSIS — I10 Essential (primary) hypertension: Secondary | ICD-10-CM | POA: Insufficient documentation

## 2022-10-06 DIAGNOSIS — E7800 Pure hypercholesterolemia, unspecified: Secondary | ICD-10-CM | POA: Diagnosis not present

## 2022-10-06 DIAGNOSIS — E039 Hypothyroidism, unspecified: Secondary | ICD-10-CM

## 2022-10-06 DIAGNOSIS — I350 Nonrheumatic aortic (valve) stenosis: Secondary | ICD-10-CM | POA: Diagnosis not present

## 2022-10-06 NOTE — Patient Instructions (Signed)
Medication Instructions:  No changes *If you need a refill on your cardiac medications before your next appointment, please call your pharmacy*  Follow-Up: At [facility], you and your health needs are our priority.  As part of our continuing mission to provide you with exceptional heart care, we have created designated Provider Care Teams.  These Care Teams include your primary Cardiologist (physician) and Advanced Practice Providers (APPs -  Physician Assistants and Nurse Practitioners) who all work together to provide you with the care you need, when you need it.  We recommend signing up for the patient portal called "MyChart".  Sign up information is provided on this After Visit Summary.  MyChart is used to connect with patients for Virtual Visits (Telemedicine).  Patients are able to view lab/test results, encounter notes, upcoming appointments, etc.  Non-urgent messages can be sent to your provider as well.   To learn more about what you can do with MyChart, go to [email or web address].    Your next appointment:   3 year(s)  Provider:   Dr Royann Shivers

## 2022-10-06 NOTE — Progress Notes (Signed)
Cardiology Office Note:  .   Date:  10/06/2022  ID:  Colin Robbins, DOB 1931/10/12, MRN [id number] PCP: Pearson Grippe, MD  [facility] Providers Cardiologist:  None    History of Present Illness: .   Colin Robbins is a 89 y.o. male referred in consultation by Dr. Pearson Grippe for aortic stenosis.  He has a longstanding history of mild hypertension and hypercholesterolemia.  He does not have a history of coronary disease or peripheral arterial disease, stroke or TIA or cardiac rhythm problems.  He lives independently and takes care of his housework and yard work by himself.  He uses a push mower to trim the edges of his yard and occasionally uses a weed eater.  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  He denies any problems of shortness of breath or chest pressure or dizziness/syncope with physical activity.  He does describe an unusual pattern of dizziness in the afternoon when he gets up from his then and walks to the kitchen and has brief dizziness that resolves after standing still for a minute or 2.  This does not happen with more intense physical activity out in the yard.  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  He has had a murmur for many years.  He has had repeated echocardiograms performed via [facility].  In 2020, the peak gradient across the aortic valve was 17.3 mmHg.  On 02/18/2022 the peak grain across aortic valve was 19.7 mmHg, mean gradient 10 mmHg, calculated aortic valve area 2.09 cm.  The aortic valve is described as having 3 leaflets, with moderate calcification.  Both echocardiogram showed normal left ventricular systolic function and impaired relaxation pattern of mitral inflow.  There were no other significant valvular abnormalities.  ROS: The patient specifically denies any chest pain at rest exertion, dyspnea at rest or with exertion, orthopnea, paroxysmal nocturnal dyspnea, syncope, palpitations, focal neurological  deficits, intermittent claudication, lower extremity edema, unexplained weight gain, cough, hemoptysis or wheezing.   Studies Reviewed: Marland Kitchen   EKG Interpretation Date/Time:  Tuesday October 06 2022 14:02:29 EDT Ventricular Rate:  69 PR Interval:  202 QRS Duration:  114 QT Interval:  386 QTC Calculation: 413 R Axis:   -43  Text Interpretation: Normal sinus rhythm Left axis deviation Minimal voltage criteria for LVH, may be normal variant ( Cornell product ) No previous ECGs available Confirmed by Artha Stavros [phone]) on 10/06/2022 2:13:13 PM   Reports of echocardiograms from 2020 and 02/18/2022  Labs from PCP 08/03/2022 Glucose 99, creatinine 1.03, potassium 5.1, normal liver function tests, hemoglobin 13.0, TSH 7.240, free T4 0.80 Cholesterol 146, triglycerides 113, HDL 43, LDL 82   Risk Assessment/Calculations:             Physical Exam:   VS:  BP 139/81   Pulse 69   Ht 6' (1.829 m)   Wt 168 lb 6.4 oz (76.4 kg)   SpO2 93%   BMI 22.84 kg/m    Wt Readings from Last 3 Encounters:  10/06/22 168 lb 6.4 oz (76.4 kg)  06/05/21 170 lb (77.1 kg)  12/01/19 170 lb 12.8 oz (77.5 kg)    GEN: Well nourished, well developed in no acute distress NECK: No JVD; No carotid bruits CARDIAC: RRR, normal S1 and very distinct S2, 3/6 early peaking aortic ejection murmur radiating towards the carotids, no diastolic murmurs, rubs, gallops RESPIRATORY:  Clear to auscultation without rales, wheezing or rhonchi  ABDOMEN: Soft,  non-tender, non-distended EXTREMITIES:  No edema; No deformity   ASSESSMENT AND PLAN: .   Aortic stenosis: Mild, progressing very slowly, asymptomatic despite a remarkably active lifestyle for his age.  I think it is best if his echocardiograms are followed in the same location so that there can be direct image comparison.  Would not anticipate the development of hemodynamically important aortic stenosis for at least another 3-5 years.  Asked him to promptly report symptoms of  exertional dyspnea/exertional angina/exertional syncope, but otherwise he does not need yearly cardiology follow-up.  Would plan to see him back in about 3 years. HTN: Borderline high today but acceptable for his age.  Would not treat more aggressively to avoid the risk of falls. Hypercholesterolemia: All his lipid parameters are in target range.  Continue current medications. Hypothyroidism: Clinically he appears to be euthyroid.  Labs performed a couple of months ago showed his TSH was borderline increased and the T4 is borderline low.  Adjustment per PCP.       Dispo: Call if you develop exertional symptoms of angina/syncope/dyspnea.  Follow-up in 3 years.  Signed, Thurmon Fair, MD

## 2022-12-31 DIAGNOSIS — Z23 Encounter for immunization: Secondary | ICD-10-CM | POA: Diagnosis not present

## 2023-01-27 DIAGNOSIS — H524 Presbyopia: Secondary | ICD-10-CM | POA: Diagnosis not present

## 2023-01-27 DIAGNOSIS — H401121 Primary open-angle glaucoma, left eye, mild stage: Secondary | ICD-10-CM | POA: Diagnosis not present

## 2023-01-27 DIAGNOSIS — H26493 Other secondary cataract, bilateral: Secondary | ICD-10-CM | POA: Diagnosis not present

## 2023-01-27 DIAGNOSIS — H40021 Open angle with borderline findings, high risk, right eye: Secondary | ICD-10-CM | POA: Diagnosis not present

## 2023-01-27 DIAGNOSIS — H04123 Dry eye syndrome of bilateral lacrimal glands: Secondary | ICD-10-CM | POA: Diagnosis not present

## 2023-02-02 DIAGNOSIS — E7800 Pure hypercholesterolemia, unspecified: Secondary | ICD-10-CM | POA: Diagnosis not present

## 2023-02-02 DIAGNOSIS — E039 Hypothyroidism, unspecified: Secondary | ICD-10-CM | POA: Diagnosis not present

## 2023-02-02 DIAGNOSIS — N1831 Chronic kidney disease, stage 3a: Secondary | ICD-10-CM | POA: Diagnosis not present

## 2023-02-09 DIAGNOSIS — N1831 Chronic kidney disease, stage 3a: Secondary | ICD-10-CM | POA: Diagnosis not present

## 2023-02-09 DIAGNOSIS — E7800 Pure hypercholesterolemia, unspecified: Secondary | ICD-10-CM | POA: Diagnosis not present

## 2023-02-09 DIAGNOSIS — I10 Essential (primary) hypertension: Secondary | ICD-10-CM | POA: Diagnosis not present

## 2023-02-09 DIAGNOSIS — Q253 Supravalvular aortic stenosis: Secondary | ICD-10-CM | POA: Diagnosis not present

## 2023-02-09 DIAGNOSIS — K219 Gastro-esophageal reflux disease without esophagitis: Secondary | ICD-10-CM | POA: Diagnosis not present

## 2023-02-09 DIAGNOSIS — R011 Cardiac murmur, unspecified: Secondary | ICD-10-CM | POA: Diagnosis not present

## 2023-02-09 DIAGNOSIS — E039 Hypothyroidism, unspecified: Secondary | ICD-10-CM | POA: Diagnosis not present

## 2023-02-09 DIAGNOSIS — G250 Essential tremor: Secondary | ICD-10-CM | POA: Diagnosis not present

## 2023-08-02 DIAGNOSIS — H40021 Open angle with borderline findings, high risk, right eye: Secondary | ICD-10-CM | POA: Diagnosis not present

## 2023-08-02 DIAGNOSIS — H401121 Primary open-angle glaucoma, left eye, mild stage: Secondary | ICD-10-CM | POA: Diagnosis not present

## 2023-08-10 DIAGNOSIS — E039 Hypothyroidism, unspecified: Secondary | ICD-10-CM | POA: Diagnosis not present

## 2023-08-10 DIAGNOSIS — E7800 Pure hypercholesterolemia, unspecified: Secondary | ICD-10-CM | POA: Diagnosis not present

## 2023-08-10 DIAGNOSIS — N1831 Chronic kidney disease, stage 3a: Secondary | ICD-10-CM | POA: Diagnosis not present

## 2023-08-17 DIAGNOSIS — R011 Cardiac murmur, unspecified: Secondary | ICD-10-CM | POA: Diagnosis not present

## 2023-08-17 DIAGNOSIS — Q253 Supravalvular aortic stenosis: Secondary | ICD-10-CM | POA: Diagnosis not present

## 2023-08-17 DIAGNOSIS — Z0000 Encounter for general adult medical examination without abnormal findings: Secondary | ICD-10-CM | POA: Diagnosis not present

## 2023-08-17 DIAGNOSIS — G250 Essential tremor: Secondary | ICD-10-CM | POA: Diagnosis not present

## 2023-08-17 DIAGNOSIS — I10 Essential (primary) hypertension: Secondary | ICD-10-CM | POA: Diagnosis not present

## 2023-08-17 DIAGNOSIS — N1831 Chronic kidney disease, stage 3a: Secondary | ICD-10-CM | POA: Diagnosis not present

## 2023-08-17 DIAGNOSIS — E039 Hypothyroidism, unspecified: Secondary | ICD-10-CM | POA: Diagnosis not present

## 2023-08-17 DIAGNOSIS — M25511 Pain in right shoulder: Secondary | ICD-10-CM | POA: Diagnosis not present

## 2023-08-17 DIAGNOSIS — M25512 Pain in left shoulder: Secondary | ICD-10-CM | POA: Diagnosis not present

## 2023-08-17 DIAGNOSIS — E7800 Pure hypercholesterolemia, unspecified: Secondary | ICD-10-CM | POA: Diagnosis not present

## 2023-08-17 DIAGNOSIS — M25552 Pain in left hip: Secondary | ICD-10-CM | POA: Diagnosis not present

## 2023-09-02 DIAGNOSIS — H401121 Primary open-angle glaucoma, left eye, mild stage: Secondary | ICD-10-CM | POA: Diagnosis not present

## 2023-09-02 DIAGNOSIS — H40021 Open angle with borderline findings, high risk, right eye: Secondary | ICD-10-CM | POA: Diagnosis not present

## 2023-10-15 DIAGNOSIS — X32XXXA Exposure to sunlight, initial encounter: Secondary | ICD-10-CM | POA: Diagnosis not present

## 2023-10-15 DIAGNOSIS — L570 Actinic keratosis: Secondary | ICD-10-CM | POA: Diagnosis not present

## 2023-12-07 DIAGNOSIS — X32XXXD Exposure to sunlight, subsequent encounter: Secondary | ICD-10-CM | POA: Diagnosis not present

## 2023-12-07 DIAGNOSIS — Z08 Encounter for follow-up examination after completed treatment for malignant neoplasm: Secondary | ICD-10-CM | POA: Diagnosis not present

## 2023-12-07 DIAGNOSIS — L570 Actinic keratosis: Secondary | ICD-10-CM | POA: Diagnosis not present

## 2023-12-07 DIAGNOSIS — Z85828 Personal history of other malignant neoplasm of skin: Secondary | ICD-10-CM | POA: Diagnosis not present
